# Patient Record
Sex: Male | Born: 1950
Health system: Southern US, Community
[De-identification: ages and names within clinical notes are randomized; demographics above are authoritative.]

## PROBLEM LIST (undated history)

## (undated) DIAGNOSIS — H269 Unspecified cataract: Secondary | ICD-10-CM

## (undated) DIAGNOSIS — N400 Enlarged prostate without lower urinary tract symptoms: Secondary | ICD-10-CM

## (undated) DIAGNOSIS — I639 Cerebral infarction, unspecified: Secondary | ICD-10-CM

## (undated) DIAGNOSIS — T7840XA Allergy, unspecified, initial encounter: Secondary | ICD-10-CM

## (undated) DIAGNOSIS — C801 Malignant (primary) neoplasm, unspecified: Secondary | ICD-10-CM

## (undated) DIAGNOSIS — R42 Dizziness and giddiness: Secondary | ICD-10-CM

## (undated) DIAGNOSIS — G4730 Sleep apnea, unspecified: Secondary | ICD-10-CM

## (undated) DIAGNOSIS — G901 Familial dysautonomia [Riley-Day]: Secondary | ICD-10-CM

## (undated) DIAGNOSIS — Z8673 Personal history of transient ischemic attack (TIA), and cerebral infarction without residual deficits: Secondary | ICD-10-CM

## (undated) DIAGNOSIS — N182 Chronic kidney disease, stage 2 (mild): Secondary | ICD-10-CM

## (undated) DIAGNOSIS — I10 Essential (primary) hypertension: Secondary | ICD-10-CM

## (undated) DIAGNOSIS — K118 Other diseases of salivary glands: Secondary | ICD-10-CM

## (undated) DIAGNOSIS — M1990 Unspecified osteoarthritis, unspecified site: Secondary | ICD-10-CM

## (undated) DIAGNOSIS — K589 Irritable bowel syndrome without diarrhea: Secondary | ICD-10-CM

## (undated) DIAGNOSIS — L719 Rosacea, unspecified: Secondary | ICD-10-CM

## (undated) DIAGNOSIS — Q211 Atrial septal defect: Secondary | ICD-10-CM

## (undated) HISTORY — DX: Allergy, unspecified, initial encounter: T78.40XA

## (undated) HISTORY — DX: Personal history of transient ischemic attack (TIA), and cerebral infarction without residual deficits: Z86.73

## (undated) HISTORY — DX: Malignant (primary) neoplasm, unspecified: C80.1

## (undated) HISTORY — DX: Familial dysautonomia (riley-day): G90.1

## (undated) HISTORY — DX: Unspecified osteoarthritis, unspecified site: M19.90

## (undated) HISTORY — DX: Benign prostatic hyperplasia without lower urinary tract symptoms: N40.0

## (undated) HISTORY — DX: Atrial septal defect: Q21.1

## (undated) HISTORY — DX: Dizziness and giddiness: R42

## (undated) HISTORY — DX: Rosacea, unspecified: L71.9

## (undated) HISTORY — DX: Unspecified cataract: H26.9

## (undated) HISTORY — DX: Essential (primary) hypertension: I10

## (undated) HISTORY — DX: Irritable bowel syndrome without diarrhea: K58.9

## (undated) HISTORY — DX: Cerebral infarction, unspecified: I63.9

## (undated) HISTORY — DX: Chronic kidney disease, stage 2 (mild): N18.2

## (undated) HISTORY — DX: Sleep apnea, unspecified: G47.30

---
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
## 1997-05-06 HISTORY — PX: EYE SURGERY: SHX253

## 2013-04-08 DIAGNOSIS — N138 Other obstructive and reflux uropathy: Secondary | ICD-10-CM | POA: Insufficient documentation

## 2013-04-08 DIAGNOSIS — N400 Enlarged prostate without lower urinary tract symptoms: Secondary | ICD-10-CM

## 2013-04-08 HISTORY — DX: Benign prostatic hyperplasia without lower urinary tract symptoms: N40.0

## 2013-10-12 DIAGNOSIS — I10 Essential (primary) hypertension: Secondary | ICD-10-CM

## 2013-10-12 HISTORY — DX: Essential (primary) hypertension: I10

## 2015-12-25 DIAGNOSIS — Q211 Atrial septal defect: Secondary | ICD-10-CM | POA: Insufficient documentation

## 2015-12-25 DIAGNOSIS — Z8673 Personal history of transient ischemic attack (TIA), and cerebral infarction without residual deficits: Secondary | ICD-10-CM | POA: Insufficient documentation

## 2015-12-25 DIAGNOSIS — Q2112 Patent foramen ovale: Secondary | ICD-10-CM | POA: Insufficient documentation

## 2015-12-25 HISTORY — DX: Patent foramen ovale: Q21.12

## 2015-12-25 HISTORY — DX: Personal history of transient ischemic attack (TIA), and cerebral infarction without residual deficits: Z86.73

## 2015-12-25 HISTORY — DX: Atrial septal defect: Q21.1

## 2016-11-27 ENCOUNTER — Other Ambulatory Visit: Payer: Self-pay

## 2016-11-27 ENCOUNTER — Encounter: Payer: Self-pay | Admitting: Physician Assistant

## 2016-11-27 ENCOUNTER — Ambulatory Visit (INDEPENDENT_AMBULATORY_CARE_PROVIDER_SITE_OTHER): Payer: BLUE CROSS/BLUE SHIELD | Admitting: Physician Assistant

## 2016-11-27 VITALS — BP 137/75 | HR 82 | Ht 70.0 in | Wt 203.0 lb

## 2016-11-27 DIAGNOSIS — L719 Rosacea, unspecified: Secondary | ICD-10-CM | POA: Insufficient documentation

## 2016-11-27 DIAGNOSIS — Z1211 Encounter for screening for malignant neoplasm of colon: Secondary | ICD-10-CM | POA: Diagnosis not present

## 2016-11-27 DIAGNOSIS — I951 Orthostatic hypotension: Secondary | ICD-10-CM | POA: Diagnosis not present

## 2016-11-27 DIAGNOSIS — K589 Irritable bowel syndrome without diarrhea: Secondary | ICD-10-CM

## 2016-11-27 DIAGNOSIS — R42 Dizziness and giddiness: Secondary | ICD-10-CM | POA: Diagnosis not present

## 2016-11-27 HISTORY — DX: Irritable bowel syndrome, unspecified: K58.9

## 2016-11-27 MED ORDER — DICYCLOMINE HCL 10 MG PO CAPS: 10.0000 mg | ORAL_CAPSULE | Freq: Three times a day (TID) | ORAL | 0 refills | Status: DC

## 2016-11-27 NOTE — Progress Notes (Addendum)
HPI:                                                                Blake Rice is a 66 y.o. male who presents to [facility]: [facility] today for lightheadedness  Blake Rice is a current patient at [facility]. He states he "might switch here." Today he presents for a "second opinion" on his chronic lightheadedness.  Patient with PMH of CVA, PFO, HTN, BPH, and IBS reports intermittent episodes of lightheadedness lasting seconds. Reports this occurs following moderate and high intensity exertion, such as running 6 miles, or with position changes. Symptoms resolve on their own. Denies dizziness or LOC. This has been going on for years since 2003. He reports episodes are becoming more frequent and worsening. He feels it may be related to IBS because sometimes he will burp and it improves his lightheadedness. He has been evaluated by neurology and cardiology. He had a negative exercise stress in 01/2016 and a negative transcranial [place] vasoreactivity test 03/2016. MRI brain from 12/2015 was unremarkable except for mild chronic ischemic changes and possible old infarct in the posterior insular cortex. Reports carotid ultrasound was negative Last TSH 1.45 (10/2016)  He is also interested in Cologuard. Reports colonoscopy with 1 polyp in 2005. Denies family history of colon cancer and hereditary colon polyps disorder.   Health Maintenance Health Maintenance  Topic Date Due  . Hepatitis C Screening  01-22-1951  . HIV Screening  01/30/1968  . TETANUS/TDAP  01/30/1972  . COLONOSCOPY  01/30/2003  . INFLUENZA VACCINE  12/04/2016     Past Medical History:  Diagnosis Date  . BPH (benign prostatic hyperplasia) 04/08/2013  . Episodic lightheadedness   . History of CVA (cerebrovascular accident) 12/25/2015  . HTN (hypertension) 10/12/2013  . Irritable bowel syndrome 11/27/2016  . PFO (patent foramen ovale) 12/25/2015  . Rosacea    Past Surgical History:   Procedure Laterality Date  . EYE SURGERY Bilateral 1999   cataract   Social History  Substance Use Topics  . Smoking status: Former Smoker    Packs/day: 1.00    Years: 27.00    Types: Cigarettes    Quit date: 08/10/2000  . Smokeless tobacco: Never Used  . Alcohol use Yes   family history is not on file.  ROS: Review of Systems  Constitutional: Negative.   HENT: Negative.   Eyes: Negative.   Respiratory: Negative.   Cardiovascular: Negative.   Gastrointestinal: Negative.   Genitourinary:       + nocturia  Musculoskeletal: Negative.   Skin: Negative.   Neurological: Negative for tingling, tremors, sensory change, speech change, focal weakness, seizures, loss of consciousness and headaches.       + lightheadedness  Endo/Heme/Allergies: Negative.   Psychiatric/Behavioral: Negative.      Medications: Current Outpatient Prescriptions  Medication Sig Dispense Refill  . ampicillin (PRINCIPEN) 500 MG capsule Take 500 mg by mouth daily.    Marland Kitchen aspirin EC 81 MG tablet Take 81 mg by mouth daily.    . clindamycin (CLINDAGEL) 1 % gel Apply topically 2 (two) times daily.    . finasteride (PROSCAR) 5 MG tablet Take 5 mg by mouth daily.    . tamsulosin (FLOMAX) 0.4 MG CAPS capsule Take 0.4  mg by mouth daily.    Marland Kitchen dicyclomine (BENTYL) 10 MG capsule Take 1 capsule (10 mg total) by mouth 4 (four) times daily -  before meals and at bedtime. 120 capsule 0   No current facility-administered medications for this visit.    Allergies  Allergen Reactions  . Oxytetracycline Other (See Comments)    Unknown       Objective:  BP 137/75   Pulse 82   Ht 5\' 10"  (1.778 m)   Wt 203 lb (92.1 kg)   SpO2 95%   BMI 29.13 kg/m  Gen: well-groomed, cooperative, not ill-appearing, no distress HEENT: normal conjunctiva, TM's clear, oropharynx clear, moist mucus membranes, no thyromegaly or tenderness Pulm: Normal work of breathing, normal phonation, clear to auscultation bilaterally CV: Normal  rate, regular rhythm, s1 and s2 distinct, no murmurs, clicks or rubs, no carotid bruit Neuro: alert and oriented x 3, EOM's intact, PERRLA, DTR's intact, normal tone, no tremor MSK: moving all extremities, normal gait and station, no peripheral edema Skin: warm and dry, no rashes or lesions on exposed skin Psych: normal affect, euthymic mood, normal speech and thought content   No results found for this or any previous visit (from the past 72 hour(s)). No results found.  No flowsheet data found. Orthostatic VS for the past 24 hrs:  BP- Lying Pulse- Lying BP- Sitting Pulse- Sitting BP- Standing at 0 minutes Pulse- Standing at 0 minutes  11/27/16 1138 152/82 81 137/84 89 117/76 91       Assessment and Plan: 66 y.o. male with    1. Colon cancer screening - discussed with history of polyp this increases likelihood of a positive Cologuard and would trigger a need for diagnostic colonoscopy. Patient expressed understanding and wanted to move forward with Cologuard. Order placed  2. Episodic lightheadedness - reviewed outside records in [place], including recent labs from 10/24/16 and 11/25/16, Neuro Transcranial Vasoreactivity Test (03/20/16), Exercise Stress (01/17/16) and MR Brain 01/03/16 - vital signs showed orthostasis. Given his negative cardiac and neuro work-ups and orthostatic BP's, chronic orthostatic hypotension is most likely the cause of his lightheadedness. He does not have any cerebellar dysfunction or dysautonomia - patient is on Flomax and Finasteride for BPH, which is known to cause orthostatic hypotension. Recommended a trial of holding his Flomax. Patient was reluctant to do so because of his obstructive symptoms - offered Echocardiogram to r/o valvular insufficiency and heart failure. Patient declined  3. Irritable bowel syndrome, unspecified type - dicyclomine (BENTYL) 10 MG capsule; Take 1 capsule (10 mg total) by mouth 4 (four) times daily -  before meals and at  bedtime.  Dispense: 120 capsule; Refill: 0   No orders of the defined types were placed in this encounter.   Patient education and anticipatory guidance given Patient agrees with treatment plan Follow-up in 4 weeks or sooner as needed  Darlyne Russian PA-C

## 2016-11-27 NOTE — Patient Instructions (Addendum)
-   Let's trial low-dose antispasmodic for your IBS  - I do think we should get an echocardiogram (ultrasound of your heart) to make sure your heart is structurally normal, that there is no valvular disease or pumping dysfunction  - At some point I would like to trial coming off of your Flomax and seeing how your symptoms respond. You can discuss this plan with your Urologist     - You will be contacted by [employer] about your Cologuard. We recommend contacting your insurance company ahead of time to see if you will have a co-pay or out-of-pocket cost [place] [phone]

## 2016-11-28 ENCOUNTER — Encounter: Payer: Self-pay | Admitting: Physician Assistant

## 2016-11-28 DIAGNOSIS — I951 Orthostatic hypotension: Secondary | ICD-10-CM | POA: Insufficient documentation

## 2016-11-28 DIAGNOSIS — R81 Glycosuria: Secondary | ICD-10-CM | POA: Insufficient documentation

## 2016-11-28 DIAGNOSIS — C678 Malignant neoplasm of overlapping sites of bladder: Secondary | ICD-10-CM | POA: Insufficient documentation

## 2016-12-01 ENCOUNTER — Encounter: Payer: Self-pay | Admitting: Physician Assistant

## 2016-12-02 ENCOUNTER — Other Ambulatory Visit: Payer: Self-pay | Admitting: Physician Assistant

## 2016-12-02 DIAGNOSIS — R42 Dizziness and giddiness: Secondary | ICD-10-CM

## 2016-12-02 NOTE — Progress Notes (Signed)
Echo ordered per Pt request, information in last OV where Provider offered imaging.

## 2016-12-04 ENCOUNTER — Ambulatory Visit (HOSPITAL_BASED_OUTPATIENT_CLINIC_OR_DEPARTMENT_OTHER)
Admission: RE | Admit: 2016-12-04 | Discharge: 2016-12-04 | Disposition: A | Discharge status: Home / Self Care | Payer: BLUE CROSS/BLUE SHIELD | Source: Ambulatory Visit | Attending: Physician Assistant | Admitting: Physician Assistant

## 2016-12-04 DIAGNOSIS — Z8673 Personal history of transient ischemic attack (TIA), and cerebral infarction without residual deficits: Secondary | ICD-10-CM | POA: Insufficient documentation

## 2016-12-04 DIAGNOSIS — I10 Essential (primary) hypertension: Secondary | ICD-10-CM | POA: Insufficient documentation

## 2016-12-04 DIAGNOSIS — R42 Dizziness and giddiness: Secondary | ICD-10-CM

## 2016-12-04 DIAGNOSIS — I77810 Thoracic aortic ectasia: Secondary | ICD-10-CM | POA: Diagnosis not present

## 2016-12-04 NOTE — Progress Notes (Signed)
  Echocardiogram 2D Echocardiogram has been performed.  Blake Rice 12/04/2016, 3:00 PM

## 2016-12-05 ENCOUNTER — Encounter: Payer: Self-pay | Admitting: Physician Assistant

## 2016-12-05 DIAGNOSIS — Z9289 Personal history of other medical treatment: Secondary | ICD-10-CM | POA: Insufficient documentation

## 2016-12-05 NOTE — Progress Notes (Signed)
Hi Blake Rice,  Your echocardiogram was normal. Your heart is structurally normal, no evidence of valvular disease. It's pumping ability (ejection fraction) is great.  I still feel strongly that your symptoms are related to orthostasis, which could be caused/worsened by your prostate medications. When possible, I would recommend trialing coming off of these medicines and seeing if symptoms improve.  Best, Evlyn Clines

## 2016-12-08 ENCOUNTER — Encounter: Payer: Self-pay | Admitting: Physician Assistant

## 2016-12-09 ENCOUNTER — Telehealth: Payer: Self-pay | Admitting: *Deleted

## 2016-12-09 NOTE — Telephone Encounter (Signed)
Called patient to see if he wanted to [place] to refer for colonoscopy since we received notice that cologaurd has not been turned in.

## 2016-12-12 ENCOUNTER — Ambulatory Visit (INDEPENDENT_AMBULATORY_CARE_PROVIDER_SITE_OTHER): Payer: BLUE CROSS/BLUE SHIELD | Admitting: Physician Assistant

## 2016-12-12 VITALS — BP 150/81 | HR 92 | Temp 97.4°F | Wt 199.0 lb

## 2016-12-12 DIAGNOSIS — I951 Orthostatic hypotension: Secondary | ICD-10-CM

## 2016-12-12 DIAGNOSIS — K589 Irritable bowel syndrome without diarrhea: Secondary | ICD-10-CM | POA: Diagnosis not present

## 2016-12-12 DIAGNOSIS — R42 Dizziness and giddiness: Secondary | ICD-10-CM | POA: Diagnosis not present

## 2016-12-12 MED ORDER — PEPPERMINT OIL 50 MG PO CPDR: 50.0000 mg | DELAYED_RELEASE_CAPSULE | Freq: Every day | ORAL | 11 refills | Status: DC

## 2016-12-12 NOTE — Progress Notes (Signed)
HPI:                                                                Blake Rice is a 66 y.o. male who presents to [facility] today for IBS follow-up  Lightheadedness: Patient reports he self-discontinued his Finasteride 2 weeks ago. He also self-discontinued Flomax for 3 days and noted that his lightheadedness completely resolved. However, he was having worsening lower urinary tract obstructive symptoms and was afraid he would not be able to urinate. After resuming the Flomax, lightheadedness returned. Echocardiogram from 12/04/16 was normal.  IBS: did not take the Bentyl because he was concerned it could worsen his lightheadedness. Notices increased abdominal discomfort when he feels anxious. Denies fever, chills, unintended weight loss, melena, hematochezia, tenesmus.  Past Medical History:  Diagnosis Date  . BPH (benign prostatic hyperplasia) 04/08/2013  . Episodic lightheadedness   . History of CVA (cerebrovascular accident) 12/25/2015  . HTN (hypertension) 10/12/2013  . Irritable bowel syndrome 11/27/2016  . PFO (patent foramen ovale) 12/25/2015  . Rosacea    Past Surgical History:  Procedure Laterality Date  . EYE SURGERY Bilateral 1999   cataract   Social History  Substance Use Topics  . Smoking status: Former Smoker    Packs/day: 1.00    Years: 27.00    Types: Cigarettes    Quit date: 08/10/2000  . Smokeless tobacco: Never Used  . Alcohol use Yes   family history is not on file.  ROS: negative except as noted in the HPI  Medications: Current Outpatient Prescriptions  Medication Sig Dispense Refill  . ampicillin (PRINCIPEN) 500 MG capsule Take 500 mg by mouth daily.    Marland Kitchen aspirin EC 81 MG tablet Take 81 mg by mouth daily.    . clindamycin (CLINDAGEL) 1 % gel Apply topically 2 (two) times daily.    Marland Kitchen Peppermint Oil 50 MG CPDR Take 50 mg by mouth daily. 30 capsule 11  . tamsulosin (FLOMAX) 0.4 MG CAPS capsule Take 0.4 mg by  mouth daily.     No current facility-administered medications for this visit.    Allergies  Allergen Reactions  . Oxytetracycline Other (See Comments)    Unknown       Objective:  BP (!) 150/81 (BP Location: Left Arm, Patient Position: Sitting, Cuff Size: Normal)   Pulse 92   Temp (!) 97.4 F (36.3 C) (Oral)   Wt 199 lb (90.3 kg)   SpO2 93%   BMI 28.55 kg/m  Gen: not ill-appearing, no distress, appears stated age HEENT: head normocephalic without obvious deformity, normal conjunctiva, trachea midline Pulm: Normal work of breathing, normal phonation Neuro: alert and oriented x 3, no tremor MSK: moving all extremities, normal gait and station, no peripheral edema Skin: intact; rosacea of the nose, no cyanosis Psych: well-groomed, cooperative, good eye contact, normal affect, normal speech and thought content   No results found for this or any previous visit (from the past 72 hour(s)). No results found.  No flowsheet data found.    Assessment and Plan: 66 y.o. male with   1. Irritable bowel syndrome, unspecified type - Peppermint Oil 50 MG CPDR; Take 50 mg by mouth daily.  Dispense: 30 capsule; Refill: 11 - provided with FOBT cards  2. Orthostasis - demonstrated on PE at last visit. secondary to Tamsulosin - unfortunately due to BPH, it is not a good idea to discontinue this medication right now. Patient has an appointment with Urology next week to discuss surgical options - instructed to liberalize dietary salt - when running, wear camel back with gatorade - avoid known triggers  3. Episodic lightheadedness - felt to be orthostasis as above - normal echo 12/04/2016 - negative cardiac and neuro work-ups int he past including Neuro Transcranial Vasoreactivity Test (03/20/16), Exercise Stress (01/17/16) and MR Brain 01/03/16  Patient education and anticipatory guidance given Patient agrees with treatment plan Follow-up as needed if symptoms worsen or fail to  improve  Darlyne Russian PA-C

## 2016-12-12 NOTE — Patient Instructions (Addendum)
- Continue Flomax. Discuss alternatives with Urology next week - Liberalize salt in your diet - Run with camel back and gatorade - Avoid triggers for lightheadedness, perhaps by shortening the duration of your runs   - Return fecal occult blood test for colon cancer screening   Orthostatic Hypotension Orthostatic hypotension is a sudden drop in blood pressure that happens when you quickly change positions, such as when you get up from a seated or lying position. Blood pressure is a measurement of how strongly, or weakly, your blood is pressing against the walls of your arteries. Arteries are blood vessels that carry blood from your heart throughout your body. When blood pressure is too low, you may not get enough blood to your brain or to the rest of your organs. This can cause weakness, light-headedness, rapid heartbeat, and fainting. This can last for just a few seconds or for up to a few minutes. Orthostatic hypotension is usually not a serious problem. However, if it happens frequently or gets worse, it may be a sign of something more serious. What are the causes? This condition may be caused by:  Sudden changes in posture, such as standing up quickly after you have been sitting or lying down.  Blood loss.  Loss of body fluids (dehydration).  Heart problems.  Hormone (endocrine) problems.  Pregnancy.  Severe infection.  Lack of certain nutrients.  Severe allergic reactions (anaphylaxis).  Certain medicines, such as blood pressure medicine or medicines that make the body lose excess fluids (diuretics). Sometimes, this condition can be caused by not taking medicine as directed, such as taking too much of a certain medicine.  What increases the risk? Certain factors can make you more likely to develop orthostatic hypotension, including:  Age. Risk increases as you get older.  Conditions that affect the heart or the central nervous system.  Taking certain medicines, such as  blood pressure medicine or diuretics.  Being pregnant.  What are the signs or symptoms? Symptoms of this condition may include:  Weakness.  Light-headedness.  Dizziness.  Blurred vision.  Fatigue.  Rapid heartbeat.  Fainting, in severe cases.  How is this diagnosed? This condition is diagnosed based on:  Your medical history.  Your symptoms.  Your blood pressure measurement. Your health care provider will check your blood pressure when you are: ? Lying down. ? Sitting. ? Standing.  A blood pressure reading is recorded as two numbers, such as "120 over 80" (or 120/80). The first ("top") number is called the systolic pressure. It is a measure of the pressure in your arteries as your heart beats. The second ("bottom") number is called the diastolic pressure. It is a measure of the pressure in your arteries when your heart relaxes between beats. Blood pressure is measured in a unit called mm Hg. Healthy blood pressure for adults is 120/80. If your blood pressure is below 90/60, you may be diagnosed with hypotension. Other information or tests that may be used to diagnose orthostatic hypotension include:  Your other vital signs, such as your heart rate and temperature.  Blood tests.  Tilt table test. For this test, you will be safely secured to a table that moves you from a lying position to an upright position. Your heart rhythm and blood pressure will be monitored during the test.  How is this treated? Treatment for this condition may include:  Changing your diet. This may involve eating more salt (sodium) or drinking more water.  Taking medicines to raise your blood  pressure.  Changing the dosage of certain medicines you are taking that might be lowering your blood pressure.  Wearing compression stockings. These stockings help to prevent blood clots and reduce swelling in your legs.  In some cases, you may need to go to the hospital for:  Fluid replacement. This  means you will receive fluids through an IV tube.  Blood replacement. This means you will receive donated blood through an IV tube (transfusion).  Treating an infection or heart problems, if this applies.  Monitoring. You may need to be monitored while medicines that you are taking wear off.  Follow these instructions at home: Eating and drinking   Drink enough fluid to keep your urine clear or pale yellow.  Eat a healthy diet and follow instructions from your health care provider about eating or drinking restrictions. A healthy diet includes: ? Fresh fruits and vegetables. ? Whole grains. ? Lean meats. ? Low-fat dairy products.  Eat extra salt only as directed. Do not add extra salt to your diet unless your health care provider told you to do that.  Eat frequent, small meals.  Avoid standing up suddenly after eating. Medicines  Take over-the-counter and prescription medicines only as told by your health care provider. ? Follow instructions from your health care provider about changing the dosage of your current medicines, if this applies. ? Do not stop or adjust any of your medicines on your own. General instructions  Wear compression stockings as told by your health care provider.  Get up slowly from lying down or sitting positions. This gives your blood pressure a chance to adjust.  Avoid hot showers and excessive heat as directed by your health care provider.  Return to your normal activities as told by your health care provider. Ask your health care provider what activities are safe for you.  Do not use any products that contain nicotine or tobacco, such as cigarettes and e-cigarettes. If you need help quitting, ask your health care provider.  Keep all follow-up visits as told by your health care provider. This is important. Contact a health care provider if:  You vomit.  You have diarrhea.  You have a fever for more than 2-3 days.  You feel more thirsty than  usual.  You feel weak and tired. Get help right away if:  You have chest pain.  You have a fast or irregular heartbeat.  You develop numbness in any part of your body.  You cannot move your arms or your legs.  You have trouble speaking.  You become sweaty or feel lightheaded.  You faint.  You feel short of breath.  You have trouble staying awake.  You feel confused. This information is not intended to replace advice given to you by your health care provider. Make sure you discuss any questions you have with your health care provider. Document Released: 04/12/2002 Document Revised: 01/09/2016 Document Reviewed: 10/13/2015 Elsevier Interactive Patient Education  2018 [employer].

## 2016-12-19 ENCOUNTER — Other Ambulatory Visit: Payer: Self-pay | Admitting: Physician Assistant

## 2016-12-19 ENCOUNTER — Encounter: Payer: Self-pay | Admitting: Physician Assistant

## 2016-12-19 DIAGNOSIS — K589 Irritable bowel syndrome without diarrhea: Secondary | ICD-10-CM

## 2016-12-19 LAB — HEMOCCULT GUIAC POC 1CARD (OFFICE)
Card #3 Fecal Occult Blood, POC: NEGATIVE
FECAL OCCULT BLD: NEGATIVE
Fecal Occult Blood, POC: NEGATIVE

## 2016-12-19 NOTE — Progress Notes (Signed)
Hi Bogdan,  Your stool study was negative for blood. You can repeat this in 1 year for colon cancer screening.  Best, Evlyn Clines

## 2016-12-25 ENCOUNTER — Ambulatory Visit: Payer: BLUE CROSS/BLUE SHIELD | Admitting: Physician Assistant

## 2016-12-30 HISTORY — PX: TRANSURETHRAL RESECTION OF BLADDER TUMOR WITH GYRUS (TURBT-GYRUS): SHX6458

## 2017-06-16 HISTORY — PX: TRANSURETHRAL RESECTION OF PROSTATE: SHX73

## 2018-02-03 ENCOUNTER — Ambulatory Visit (INDEPENDENT_AMBULATORY_CARE_PROVIDER_SITE_OTHER): Payer: Medicare Other | Admitting: Physician Assistant

## 2018-02-03 ENCOUNTER — Encounter: Payer: Self-pay | Admitting: Physician Assistant

## 2018-02-03 VITALS — BP 155/90 | HR 89 | Temp 98.6°F | Wt 208.0 lb

## 2018-02-03 DIAGNOSIS — Z1211 Encounter for screening for malignant neoplasm of colon: Secondary | ICD-10-CM

## 2018-02-03 DIAGNOSIS — Z8546 Personal history of malignant neoplasm of prostate: Secondary | ICD-10-CM

## 2018-02-03 DIAGNOSIS — R1084 Generalized abdominal pain: Secondary | ICD-10-CM | POA: Diagnosis not present

## 2018-02-03 DIAGNOSIS — Z8551 Personal history of malignant neoplasm of bladder: Secondary | ICD-10-CM

## 2018-02-03 DIAGNOSIS — R350 Frequency of micturition: Secondary | ICD-10-CM | POA: Insufficient documentation

## 2018-02-03 DIAGNOSIS — R809 Proteinuria, unspecified: Secondary | ICD-10-CM

## 2018-02-03 DIAGNOSIS — I951 Orthostatic hypotension: Secondary | ICD-10-CM | POA: Diagnosis not present

## 2018-02-03 DIAGNOSIS — R140 Abdominal distension (gaseous): Secondary | ICD-10-CM

## 2018-02-03 LAB — POCT URINALYSIS DIPSTICK
Bilirubin, UA: NEGATIVE
Blood, UA: NEGATIVE
GLUCOSE UA: NEGATIVE
Ketones, UA: NEGATIVE
Leukocytes, UA: NEGATIVE
Nitrite, UA: NEGATIVE
PROTEIN UA: POSITIVE — AB
Spec Grav, UA: >=1.03 — AB (ref 1.010–1.025)
Urobilinogen, UA: 1 E.U./dL
pH, UA: 5.5 (ref 5.0–8.0)

## 2018-02-03 NOTE — Patient Instructions (Signed)
For lightheadedness/orthostasis: Option to start a medication that constricts the blood vessels and raises blood pressure called Midodrine. Blood pressure would need to be monitored closely  Continue other general measures:  Arising slowly, in stages, from supine to seated to standing. This maneuver is most important in the morning, when orthostatic tolerance is lowest.  ?Avoiding straining, violent coughing, or walking in very hot weather, as these activities reduce venous return and worsen orthostatic hypotension. If necessary, treat constipation with stool softeners and diet changes, and use cough suppressants when these triggers are problematic.  ?Maintaining adequate hydration and avoiding overheating. ?Avoiding large meals  ?Ingesting meals low in carbohydrate  ?Avoiding alcohol intake  ?Drinking water with meals  ?Avoiding activities or sudden standing immediately after eating

## 2018-02-03 NOTE — Progress Notes (Signed)
HPI:                                                                Blake Rice is a 67 y.o. male who presents to [facility] today for abdominal bloating / urinary frequency   This is a pleasant 67 yo M with recent PMH of malignant neoplasm of bladder (papillary urothelial carcinoma) s/p TURBT (Aug 2018) and prostate cancer Gleason 6 s/p TURP (Feb 2019) as well as long-standing history of HTN, orthostasis, IBS, hx of CVA (2017) and rosacea.  For 2 weeks he has had abdominal bloating and "a dull stomach ache." He is also having urinary frequency, urgency, and nocturia twice per night. Denies change in bowel habits. Denies fever, chills, nausea, vomiting. He is followed by Dr. Ihor Dow Q31months for history of bladder cancer. He was self-catheterizing due to urinary retention for some time, but since his TURP he has not had any recent catheterization IPSS Questionnaire (AUA-7): Over the past month.   1)  How often have you had a sensation of not emptying your bladder completely after you finish urinating?  0 - Not at all  2)  How often have you had to urinate again less than two hours after you finished urinating? 4 - More than half the time  3)  How often have you found you stopped and started again several times when you urinated?  1 - Less than 1 time in 5  4) How difficult have you found it to postpone urination?  0 - Not at all  5) How often have you had a weak urinary stream?  4 - More than half the time  6) How often have you had to push or strain to begin urination?  0 - Not at all  7) How many times did you most typically get up to urinate from the time you went to bed until the time you got up in the morning?  2 - 2 times  Total score:  0-7 mildly symptomatic   8-19 moderately symptomatic   20-35 severely symptomatic    He also c/o recurrent positional lightheadedness worse with prolonged sitting/position changes beginning  in August. He had extensive cardiac and neurologic work-ups and symptoms were felt to be due to orthostasis 2/2 Flomax. After discontinuing Flomax he was symptom-free for approx 6-8 months. Unfortunately his symptoms have returned.  Lastly would like to complete Cologuard for colon cancer screening. Denies family history of colon cancer or polyposis. Personal history of normal colonoscopy over 10 years ago.  Depression screen PHQ 2/9 02/03/2018  Decreased Interest 0  Down, Depressed, Hopeless 0  PHQ - 2 Score 0    No flowsheet data found.    Past Medical History:  Diagnosis Date  . BPH (benign prostatic hyperplasia) 04/08/2013  . Episodic lightheadedness   . History of CVA (cerebrovascular accident) 12/25/2015  . HTN (hypertension) 10/12/2013  . Irritable bowel syndrome 11/27/2016  . PFO (patent foramen ovale) 12/25/2015  . Rosacea    Past Surgical History:  Procedure Laterality Date  . EYE SURGERY Bilateral 1999   cataract  . TRANSURETHRAL RESECTION OF BLADDER TUMOR WITH GYRUS (TURBT-GYRUS)  12/30/2016  . TRANSURETHRAL RESECTION OF PROSTATE  06/16/2017   Social  History   Tobacco Use  . Smoking status: Former Smoker    Packs/day: 1.00    Years: 27.00    Pack years: 27.00    Types: Cigarettes    Last attempt to quit: 08/10/2000    Years since quitting: 17.5  . Smokeless tobacco: Never Used  Substance Use Topics  . Alcohol use: Yes   family history is not on file.    ROS: negative except as noted in the HPI  Medications: Current Outpatient Medications  Medication Sig Dispense Refill  . aspirin EC 81 MG tablet Take 81 mg by mouth daily.    Marland Kitchen ampicillin (PRINCIPEN) 500 MG capsule Take 1 capsule (500 mg total) by mouth daily. 90 capsule 3  . clindamycin (CLEOCIN T) 1 % SWAB Apply topically two times daily 60 Package 0  . phenazopyridine (PYRIDIUM) 200 MG tablet Take by mouth.     No current facility-administered medications for this visit.    Allergies  Allergen  Reactions  . Oxytetracycline Other (See Comments)    Unknown FOUND WITH ALLERGY SKIN TEST AT AGE OF 6       Objective:  BP (!) 155/90   Pulse 89   Temp 98.6 F (37 C) (Oral)   Wt 208 lb (94.3 kg)   BMI 29.84 kg/m  Gen:  alert, not ill-appearing, no distress, appropriate for age HEENT: head normocephalic without obvious abnormality, conjunctiva and cornea clear, trachea midline Pulm: Normal work of breathing, normal phonation, clear to auscultation bilaterally, no wheezes, rales or rhonchi CV: Normal rate, regular rhythm, s1 and s2 distinct, no murmurs, clicks or rubs  GI: abdomen soft, mildly distended, no palpable fluid wave, no tenderness, no palpable masses Neuro: alert and oriented x 3, no tremor MSK: extremities atraumatic, normal gait and station Skin: intact, no rashes on exposed skin, no jaundice, no cyanosis    No results found for this or any previous visit (from the past 72 hour(s)). No results found.    Assessment and Plan: 67 y.o. male with   .[name] was seen today for bloated.  Diagnoses and all orders for this visit:  Abdominal distension -     CBC with Differential/Platelet -     COMPLETE METABOLIC PANEL WITH GFR -     Lipase -     Hemoglobin A1c -     US Abdomen Complete; Future  Urinary frequency -     Hemoglobin A1c -     POCT Urinalysis Dipstick -     Urine Culture  Generalized abdominal pain  Proteinuria, unspecified type -     POCT Urinalysis Dipstick -     Urine Culture  Colon cancer screening Comments: Cologuard ordered 02/03/18 Orders: -     Cologuard  History of primary bladder cancer Comments: papillary urothelial carcinoma s/p TURBT 12/30/16  History of prostate cancer Comments: Gleason 6 adenocarcinoma s/p TURP 06/2017  Orthostasis   - personally reviewed most recent Urology office note dated 12/19/17 - personally reviewed CT abdomen pelvis urogram report dated 01/13/17, which showed small hypodense liver lesion.  Patient was unaware of this finding. Given his symptoms today, will obtain abdominal US. However, explained to patient that MR Liver was recommended by radiology to evaluate this lesion - CBC, CMP, lipase pending - UA was positive for protein - AUASS 11, QOL 5, primarily irritative symptoms, recommended close follow-up with Dr. Tommi Rumps for change in urinary symptoms  - for persistent orthostasis, we discussed option to discuss Midodrine. However, BP was elevated  in office today and this would need to be monitored closely. Patient declined at this time. Counseled on general measures for orthostasis.  Patient education and anticipatory guidance given Patient agrees with treatment plan Follow-up as needed if symptoms worsen or fail to improve  Darlyne Russian PA-C

## 2018-02-04 ENCOUNTER — Ambulatory Visit (INDEPENDENT_AMBULATORY_CARE_PROVIDER_SITE_OTHER): Payer: Medicare Other

## 2018-02-04 DIAGNOSIS — R140 Abdominal distension (gaseous): Secondary | ICD-10-CM

## 2018-02-04 LAB — CBC WITH DIFFERENTIAL/PLATELET
BASOS ABS: 70 {cells}/uL (ref 0–200)
Basophils Relative: 1.1 %
Eosinophils Absolute: 179 cells/uL (ref 15–500)
Eosinophils Relative: 2.8 %
HCT: 44.7 % (ref 38.5–50.0)
HEMOGLOBIN: 15.5 g/dL (ref 13.2–17.1)
Lymphs Abs: 2010 cells/uL (ref 850–3900)
MCH: 31.6 pg (ref 27.0–33.0)
MCHC: 34.7 g/dL (ref 32.0–36.0)
MCV: 91.2 fL (ref 80.0–100.0)
MPV: 9.7 fL (ref 7.5–12.5)
Monocytes Relative: 10.3 %
NEUTROS ABS: 3482 {cells}/uL (ref 1500–7800)
Neutrophils Relative %: 54.4 %
Platelets: 239 10*3/uL (ref 140–400)
RBC: 4.9 10*6/uL (ref 4.20–5.80)
RDW: 12.8 % (ref 11.0–15.0)
Total Lymphocyte: 31.4 %
WBC mixed population: 659 cells/uL (ref 200–950)
WBC: 6.4 10*3/uL (ref 3.8–10.8)

## 2018-02-04 LAB — LIPASE: LIPASE: 19 U/L (ref 7–60)

## 2018-02-04 LAB — URINE CULTURE
MICRO NUMBER: 91179075
Result:: NO GROWTH
SPECIMEN QUALITY:: ADEQUATE

## 2018-02-04 LAB — COMPLETE METABOLIC PANEL WITH GFR
AG Ratio: 1.7 (calc) (ref 1.0–2.5)
ALBUMIN MSPROF: 4.5 g/dL (ref 3.6–5.1)
ALKALINE PHOSPHATASE (APISO): 80 U/L (ref 40–115)
ALT: 24 U/L (ref 9–46)
AST: 20 U/L (ref 10–35)
BUN: 20 mg/dL (ref 7–25)
CO2: 26 mmol/L (ref 20–32)
CREATININE: 1.2 mg/dL (ref 0.70–1.25)
Calcium: 9.5 mg/dL (ref 8.6–10.3)
Chloride: 106 mmol/L (ref 98–110)
GFR, Est African American: 73 mL/min/{1.73_m2} (ref >=60)
GFR, Est Non African American: 63 mL/min/{1.73_m2} (ref >=60)
GLUCOSE: 99 mg/dL (ref 65–139)
Globulin: 2.7 g/dL (calc) (ref 1.9–3.7)
Potassium: 4.8 mmol/L (ref 3.5–5.3)
Sodium: 142 mmol/L (ref 135–146)
Total Bilirubin: 0.7 mg/dL (ref 0.2–1.2)
Total Protein: 7.2 g/dL (ref 6.1–8.1)

## 2018-02-04 LAB — HEMOGLOBIN A1C
EAG (MMOL/L): 6 (calc)
HEMOGLOBIN A1C: 5.4 %{Hb} (ref <5.7)
Mean Plasma Glucose: 108 (calc)

## 2018-02-05 ENCOUNTER — Other Ambulatory Visit: Payer: Self-pay | Admitting: Physician Assistant

## 2018-02-05 DIAGNOSIS — K769 Liver disease, unspecified: Secondary | ICD-10-CM

## 2018-02-05 NOTE — Progress Notes (Signed)
Good afternoon Blake Rice news. Your labs look great and your ultrasound showed only a significant amount of bowel gas and a small calcification in your right kidney. The bowel gas likely explains your bloating and abdominal discomfort.  The liver appears normal on ultrasound, which is reassuring. However, I would recommend just to be prudent that we obtain an MRI of your liver to be certain there is no lesion. MRI is more sensitive than ultrasound and was the recommended imaging modality.  Just so you know what I am referring to, your CT Abdomen/Pelvis Urogram from September 2018 showed "Small hypodense lesion in the liver is indeterminate. Liver MRI with and without contrast would be helpful in further evaluation."   Let me know if you have any questions or concerns. Evlyn Clines

## 2018-02-08 ENCOUNTER — Encounter: Payer: Self-pay | Admitting: Physician Assistant

## 2018-02-09 ENCOUNTER — Other Ambulatory Visit: Payer: Self-pay | Admitting: Physician Assistant

## 2018-02-09 DIAGNOSIS — L719 Rosacea, unspecified: Secondary | ICD-10-CM

## 2018-02-09 MED ORDER — AMPICILLIN 500 MG PO CAPS: 500.0000 mg | ORAL_CAPSULE | Freq: Every day | ORAL | 3 refills | Status: DC

## 2018-02-09 MED ORDER — CLINDAMYCIN PHOSPHATE 1 % EX GEL: Freq: Two times a day (BID) | CUTANEOUS | 2 refills | Status: DC

## 2018-02-18 DIAGNOSIS — N3090 Cystitis, unspecified without hematuria: Secondary | ICD-10-CM | POA: Diagnosis not present

## 2018-02-18 DIAGNOSIS — C61 Malignant neoplasm of prostate: Secondary | ICD-10-CM | POA: Diagnosis not present

## 2018-02-18 DIAGNOSIS — R935 Abnormal findings on diagnostic imaging of other abdominal regions, including retroperitoneum: Secondary | ICD-10-CM | POA: Diagnosis not present

## 2018-02-18 DIAGNOSIS — N200 Calculus of kidney: Secondary | ICD-10-CM | POA: Diagnosis not present

## 2018-02-18 DIAGNOSIS — C679 Malignant neoplasm of bladder, unspecified: Secondary | ICD-10-CM | POA: Diagnosis not present

## 2018-02-23 ENCOUNTER — Other Ambulatory Visit: Payer: Self-pay

## 2018-02-23 MED ORDER — CLINDAMYCIN PHOSPHATE 1 % EX SWAB: CUTANEOUS | 0 refills | Status: DC

## 2018-02-24 ENCOUNTER — Encounter: Payer: Self-pay | Admitting: Physician Assistant

## 2018-02-24 DIAGNOSIS — R140 Abdominal distension (gaseous): Secondary | ICD-10-CM | POA: Insufficient documentation

## 2018-02-24 DIAGNOSIS — Z8551 Personal history of malignant neoplasm of bladder: Secondary | ICD-10-CM | POA: Insufficient documentation

## 2018-02-24 DIAGNOSIS — Z1211 Encounter for screening for malignant neoplasm of colon: Secondary | ICD-10-CM | POA: Insufficient documentation

## 2018-02-24 DIAGNOSIS — Z8546 Personal history of malignant neoplasm of prostate: Secondary | ICD-10-CM | POA: Insufficient documentation

## 2018-03-28 ENCOUNTER — Other Ambulatory Visit: Payer: Self-pay

## 2018-03-28 ENCOUNTER — Encounter: Payer: Self-pay | Admitting: Emergency Medicine

## 2018-03-28 ENCOUNTER — Emergency Department (INDEPENDENT_AMBULATORY_CARE_PROVIDER_SITE_OTHER)
Admission: EM | Admit: 2018-03-28 | Discharge: 2018-03-28 | Disposition: A | Discharge status: Home / Self Care | Payer: Medicare Other | Source: Home / Self Care | Attending: Family Medicine | Admitting: Family Medicine

## 2018-03-28 DIAGNOSIS — K1121 Acute sialoadenitis: Secondary | ICD-10-CM | POA: Diagnosis not present

## 2018-03-28 LAB — POCT CBC W AUTO DIFF (K'VILLE URGENT CARE)

## 2018-03-28 MED ORDER — AMOXICILLIN-POT CLAVULANATE 875-125 MG PO TABS: 1.0000 | ORAL_TABLET | Freq: Two times a day (BID) | ORAL | 0 refills | Status: DC

## 2018-03-28 NOTE — ED Provider Notes (Signed)
[facility]    CSN: [id number] Arrival date & time: 03/28/18  1046     History   Chief Complaint Chief Complaint  Patient presents with  . Temporomandibular Joint Pain    HPI Blake Rice is a 67 y.o. male.   Patient complains of bilateral TMJ pain three days ago with mild swelling.  He has felt hot and had a low grade fever of 99.2.  The history is provided by the patient.    Past Medical History:  Diagnosis Date  . BPH (benign prostatic hyperplasia) 04/08/2013  . Episodic lightheadedness   . History of CVA (cerebrovascular accident) 12/25/2015  . HTN (hypertension) 10/12/2013  . Irritable bowel syndrome 11/27/2016  . PFO (patent foramen ovale) 12/25/2015  . Rosacea     Patient Active Problem List   Diagnosis Date Noted  . History of primary bladder cancer 02/24/2018  . History of prostate cancer 02/24/2018  . Abdominal distension 02/24/2018  . Colon cancer screening 02/24/2018  . Proteinuria 02/03/2018  . Urinary frequency 02/03/2018  . Hx of echocardiogram 12/05/2016  . Glucosuria 11/28/2016  . Orthostasis 11/28/2016  . Urinary retention due to benign prostatic hyperplasia 11/28/2016  . Episodic lightheadedness 11/27/2016  . Irritable bowel syndrome 11/27/2016  . Rosacea   . History of CVA (cerebrovascular accident) 12/25/2015  . PFO (patent foramen ovale) 12/25/2015  . HTN (hypertension) 10/12/2013  . BPH (benign prostatic hyperplasia) 04/08/2013    Past Surgical History:  Procedure Laterality Date  . EYE SURGERY Bilateral 1999   cataract  . TRANSURETHRAL RESECTION OF BLADDER TUMOR WITH GYRUS (TURBT-GYRUS)  12/30/2016  . TRANSURETHRAL RESECTION OF PROSTATE  06/16/2017       Home Medications    Prior to Admission medications   Medication Sig Start Date End Date Taking? Authorizing Provider  alfuzosin (UROXATRAL) 10 MG 24 hr tablet Take 10 mg by mouth daily with breakfast.   Yes [provider]  amoxicillin-clavulanate  (AUGMENTIN) 875-125 MG tablet Take 1 tablet by mouth 2 (two) times daily. Take with food 03/28/18   Kandra Nicolas, MD  ampicillin (PRINCIPEN) 500 MG capsule Take 1 capsule (500 mg total) by mouth daily. 02/09/18   Trixie Dredge, PA-C  aspirin EC 81 MG tablet Take 81 mg by mouth daily.    [provider]  clindamycin (CLEOCIN T) 1 % SWAB Apply topically two times daily 02/23/18   Trixie Dredge, PA-C  phenazopyridine (PYRIDIUM) 200 MG tablet Take by mouth.    [provider]    Family History No family history on file.  Social History Social History   Tobacco Use  . Smoking status: Former Smoker    Packs/day: 1.00    Years: 27.00    Pack years: 27.00    Types: Cigarettes    Last attempt to quit: 08/10/2000    Years since quitting: 17.6  . Smokeless tobacco: Never Used  Substance Use Topics  . Alcohol use: Yes  . Drug use: Not on file     Allergies   Oxytetracycline   Review of Systems Review of Systems  Constitutional: Positive for chills, fatigue and fever. Negative for activity change, appetite change and diaphoresis.  HENT: Positive for ear pain and facial swelling. Negative for congestion, sore throat and trouble swallowing.   Eyes: Negative.   Cardiovascular: Negative.   Gastrointestinal: Negative.   Genitourinary: Negative.   Musculoskeletal: Negative.   Skin: Negative.   Neurological: Negative for headaches.     Physical Exam  Triage Vital Signs ED Triage Vitals  Enc Vitals Group     BP 03/28/18 1135 136/88     Pulse Rate 03/28/18 1135 85     Resp 03/28/18 1135 16     Temp 03/28/18 1135 98 F (36.7 C)     Temp Source 03/28/18 1135 Oral     SpO2 03/28/18 1135 97 %     Weight 03/28/18 1137 200 lb (90.7 kg)     Height 03/28/18 1137 5\' 10"  (1.778 m)     Head Circumference --      Peak Flow --      Pain Score 03/28/18 1136 1     Pain Loc --      Pain Edu? --      Excl. in [place]? --    No data found.  Updated  Vital Signs BP 136/88 (BP Location: Right Arm)   Pulse 85   Temp 98 F (36.7 C) (Oral)   Resp 16   Ht 5\' 10"  (1.778 m)   Wt 90.7 kg   SpO2 97%   BMI 28.70 kg/m   Visual Acuity Right Eye Distance:   Left Eye Distance:   Bilateral Distance:    Right Eye Near:   Left Eye Near:    Bilateral Near:     Physical Exam  Constitutional: He appears well-developed and well-nourished. No distress.  HENT:  Head:    Right Ear: Tympanic membrane, external ear and ear canal normal. No mastoid tenderness.  Left Ear: Tympanic membrane, external ear and ear canal normal. No mastoid tenderness.  Nose: Nose normal.  Mouth/Throat: Oropharynx is clear and moist.  Bilateral parotid gland swelling and mild tenderness, more pronounced on the right  Eyes: Pupils are equal, round, and reactive to light. Conjunctivae and EOM are normal.  Neck: Neck supple.  Cardiovascular: Normal heart sounds.  Pulmonary/Chest: Breath sounds normal.  Lymphadenopathy:    He has no cervical adenopathy.  Neurological: He is alert.  Skin: Skin is warm and dry.  Nursing note and vitals reviewed.    UC Treatments / Results  Labs (all labs ordered are listed, but only abnormal results are displayed) Labs Reviewed  MUMPS ANTIBODY, IGG  MUMPS ANTIBODY, IGM  POCT CBC W AUTO DIFF ([facility]):  WBC 5.5; LY 41.0; MO 14.0; GR 45.0; Hgb 15.6; Platelets 194     EKG None  Radiology No results found.  Procedures Procedures (including critical care time)  Medications Ordered in UC Medications - No data to display  Initial Impression / Assessment and Plan / UC Course  I have reviewed the triage vital signs and the nursing notes.  Pertinent labs & imaging results that were available during my care of the patient were reviewed by me and considered in my medical decision making (see chart for details).    Begin empiric Augmentin. Check mumps antibodies. Followup with ENT if not improved about 4 to 5  days.  Final Clinical Impressions(s) / UC Diagnoses   Final diagnoses:  Acute parotitis     Discharge Instructions       Apply warm compresses to the affected area 3 or 4 times daily.  Gently massage the parotid glands 3 or 4 times daily.  Drink enough fluid to keep your urine clear or pale yellow.  Try sucking on sour candy. This may help to make your mouth less dry and may stimulate the flow of saliva.      ED Prescriptions    Medication Sig  Dispense Auth. Provider   amoxicillin-clavulanate (AUGMENTIN) 875-125 MG tablet Take 1 tablet by mouth 2 (two) times daily. Take with food 14 tablet Kandra Nicolas, MD        Kandra Nicolas, MD 04/03/18 [phone]

## 2018-03-28 NOTE — ED Triage Notes (Signed)
Patient reports bilateral TMJ area pain during night starting 3 days ago; feels it is accompanied by mild edema; gets better as day progresses.

## 2018-03-28 NOTE — Discharge Instructions (Addendum)
Apply warm compresses to the affected area 3 or 4 times daily. Gently massage the parotid glands 3 or 4 times daily. Drink enough fluid to keep your urine clear or pale yellow. Try sucking on sour candy. This may help to make your mouth less dry and may stimulate the flow of saliva.

## 2018-04-03 LAB — MUMPS ANTIBODY, IGM: Mumps IgM Value: <1:20 {titer}

## 2018-04-03 LAB — MUMPS ANTIBODY, IGG

## 2018-04-04 ENCOUNTER — Telehealth: Payer: Self-pay | Admitting: Emergency Medicine

## 2018-04-04 NOTE — Telephone Encounter (Signed)
Patient informed of his results.  States that his sxs went away after a couple of days of being here.  Patient will follow up as needed.

## 2018-04-07 ENCOUNTER — Ambulatory Visit (INDEPENDENT_AMBULATORY_CARE_PROVIDER_SITE_OTHER): Payer: Medicare Other | Admitting: Physician Assistant

## 2018-04-07 ENCOUNTER — Encounter: Payer: Self-pay | Admitting: Physician Assistant

## 2018-04-07 VITALS — BP 137/80 | HR 90 | Wt 215.0 lb

## 2018-04-07 DIAGNOSIS — Z87898 Personal history of other specified conditions: Secondary | ICD-10-CM | POA: Diagnosis not present

## 2018-04-07 DIAGNOSIS — I951 Orthostatic hypotension: Secondary | ICD-10-CM

## 2018-04-07 DIAGNOSIS — R42 Dizziness and giddiness: Secondary | ICD-10-CM | POA: Diagnosis not present

## 2018-04-07 DIAGNOSIS — R2689 Other abnormalities of gait and mobility: Secondary | ICD-10-CM

## 2018-04-07 DIAGNOSIS — H6122 Impacted cerumen, left ear: Secondary | ICD-10-CM | POA: Diagnosis not present

## 2018-04-07 NOTE — Progress Notes (Signed)
HPI:                                                                Blake Rice is a 67 y.o. male who presents to [facility]: [facility] today for recurrent lightheadedness  Blake Rice is a pleasant 67 yo M with PMH of malignant neoplasm of bladder (papillary urothelial carcinoma) s/p TURBT (Aug 2018) and prostate cancer Gleason 6 s/p TURP (Feb 2019), CVA (2002), PFO, HTN, orthostasis, and history of vertigo who presents today with worsening lightheadedness.  This has been a chronic problem. Symptoms first began over 10 years ago and have been waxing and waning since that time. He was symptom free for an 69-month period from September 2018 up until late summer of this year. Around mid-August of this year symptoms recurred and seem to be gradually worsening.  Symptoms described as "feeling off balance," "unsteadiness," and "near faint sensation" lasting seconds to minutes Episodes occur approx 10 times per day Between episodes he feels normal Sometimes he has an associated mild headache and increased gas/belching He is physically active and runs 2-3 days per week for 3-4 miles. Has noted that he "veers to the left" while running but otherwise denies any gait disturbance, clumsiness, staggering or falls.  Aggravating factors: - Initially worse after aerobic exercise/running  - Worse with position changes, such as getting out of bed at night to urinate.  - Symptoms are always worse when he is on alpha blockers (such as Flomax and Alfuzosin). He was prescribed Alfuzosin 5-6 weeks ago and he discontinued it about 2-3 weeks ago. Reports his symptoms were worse.  - worse when fasting/skipping meals. - states when he gets on an elevator and goes down many flights he feels like "I have the bends" Now episodes seem spontaneous and will occur even at rest.  Relieving factors:  - he feels better standing and walking around during episodes,  - eating and belching  also sometimes helps - mostly they resolve spontaneously  Pertinent PMH He self-reports a history of stroke in 2002 which afffected his right-side and he had residual speech and word-finding difficulties. Reports his gait and balance were not affected. He also self-reports being told he had an inner ear problem as a child. Apart from tinnitus, denies any hearing changes    Prior work-ups: He has been evaluated by neurology and cardiology.  12/27/15 US Carotid - unable to view report, but reported as negative by PCP at the time 01/03/2016 MR Brain w/o contrast Old small infarct left posterior insular cortex and perisylvian region. Minimal chronic microvascular ischemic changes. Otherwise normal MRI of the brain. No acute abnormality. 01/2016 exercise stress - negative 03/2016 [place] vasoreactivity test -  No important focal intracranial veloicty abnormalities were identified. The  PI was mildly increased in several segments including the BA. This may be due  to age, but could also reflect distal intracranial athero or obstruction in  that territory. There was also evaluation of cerebrovascular reserve with inhalation of 5%  CO2 in air, during monitoring of the right, and the left MCA's. There was a 60% increase in mean velocity bilaterally, suggesting normal cerebrovascular  reserve. There was no previous study available for direct comparison.    Past Medical History:  Diagnosis Date  . BPH (benign prostatic hyperplasia) 04/08/2013  . Episodic lightheadedness   . History of CVA (cerebrovascular accident) 12/25/2015  . HTN (hypertension) 10/12/2013  . Irritable bowel syndrome 11/27/2016  . PFO (patent foramen ovale) 12/25/2015  . Rosacea    Past Surgical History:  Procedure Laterality Date  . EYE SURGERY Bilateral 1999   cataract  . TRANSURETHRAL RESECTION OF BLADDER TUMOR WITH GYRUS (TURBT-GYRUS)  12/30/2016  . TRANSURETHRAL RESECTION OF PROSTATE  06/16/2017   Social History   Tobacco  Use  . Smoking status: Former Smoker    Packs/day: 1.00    Years: 27.00    Pack years: 27.00    Types: Cigarettes    Last attempt to quit: 08/10/2000    Years since quitting: 17.6  . Smokeless tobacco: Never Used  Substance Use Topics  . Alcohol use: Yes   family history is not on file.    ROS: Review of Systems  HENT: Positive for tinnitus (occasional). Negative for hearing loss.   Eyes: Negative for blurred vision and double vision.       + floaters  Gastrointestinal:       + IBS  Genitourinary: Positive for frequency and urgency.  Musculoskeletal: Negative for falls.  Neurological: Positive for headaches. Negative for dizziness, tingling, sensory change, focal weakness, seizures and loss of consciousness.  Psychiatric/Behavioral: The patient is nervous/anxious.   All other systems reviewed and are negative.    Medications: Current Outpatient Medications  Medication Sig Dispense Refill  . ampicillin (PRINCIPEN) 500 MG capsule Take 1 capsule (500 mg total) by mouth daily. 90 capsule 3  . aspirin EC 81 MG tablet Take 81 mg by mouth daily.    . clindamycin (CLEOCIN T) 1 % SWAB Apply topically two times daily 60 Package 0   No current facility-administered medications for this visit.    Allergies  Allergen Reactions  . Oxytetracycline Other (See Comments)    Unknown FOUND WITH ALLERGY SKIN TEST AT AGE OF 6       Objective:  BP 137/80   Pulse 90   Wt 215 lb (97.5 kg)   BMI 30.85 kg/m  Gen:  alert, not ill-appearing, no distress, appropriate for age 68: head normocephalic without obvious abnormality, conjunctiva and cornea clear, right TM obstructed by cerumen, left TM pearly gray and semi-transparent, hearing grossly intact, trachea midline Pulm: Normal work of breathing, normal phonation, clear to auscultation bilaterally, no wheezes, rales or rhonchi CV: Normal rate, regular rhythm, s1 and s2 distinct, no murmurs, clicks or rubs  Neuro: alert and oriented x  3 MSK: extremities atraumatic, normal gait and station Skin: intact, no rashes on exposed skin, no jaundice, no cyanosis   No data found.    No results found for this or any previous visit (from the past 72 hour(s)). No results found.    Assessment and Plan: 67 y.o. male with   .Blake Rice was seen today for dizziness.  Diagnoses and all orders for this visit:  Orthostasis  Episodic lightheadedness -     Ambulatory referral to ENT -     Ambulatory referral to Psychology  History of vertigo -     Ambulatory referral to ENT -     Ambulatory referral to Physical Therapy -     Ambulatory referral to Psychology  Imbalance -     Ambulatory referral to Physical Therapy  Impacted cerumen of left ear   Chronic lightheadedness that is worse with exercise/exertion and exacerbated by use of  alpha blockers Again patient demonstrated orthostasis on exam today with SBP drop of 20 mmHg from sitting to standing He became symptomatic shortly after performing orthostatics He has not been taking alpha blockers for over 1 month and has been symptomatic in absence of alpha blockers before, so this is more an exacerbating factor than the underlying cause At this point, autonomic dysfunction is highest on my differential and consultation with Neurology is necessary to have Araceli Bouche evaluated for Pure Autonomic Dysfunction v MSA  Symptoms are causing significant distress. Patient reports his quality of life is impacted more so by his lightheadedness than his recent cancer diagnoses I will refer him to PT for vestibular rehab  I have also referred him to a neuropsychologist for biofeedback  Noted left cerumen impaction on exam today. Patient declined irrigation due to concern for worsening dizziness/imbalance  Patient education and anticipatory guidance given Patient agrees with treatment plan Follow-up as needed if symptoms worsen or fail to improve  Darlyne Russian PA-C

## 2018-04-08 DIAGNOSIS — C679 Malignant neoplasm of bladder, unspecified: Secondary | ICD-10-CM | POA: Diagnosis not present

## 2018-04-08 DIAGNOSIS — C678 Malignant neoplasm of overlapping sites of bladder: Secondary | ICD-10-CM | POA: Diagnosis not present

## 2018-04-08 DIAGNOSIS — C61 Malignant neoplasm of prostate: Secondary | ICD-10-CM | POA: Diagnosis not present

## 2018-04-09 ENCOUNTER — Encounter: Payer: Self-pay | Admitting: Physician Assistant

## 2018-04-12 DIAGNOSIS — Z79899 Other long term (current) drug therapy: Secondary | ICD-10-CM | POA: Diagnosis not present

## 2018-04-12 DIAGNOSIS — K5909 Other constipation: Secondary | ICD-10-CM | POA: Diagnosis not present

## 2018-04-12 DIAGNOSIS — Z87891 Personal history of nicotine dependence: Secondary | ICD-10-CM | POA: Diagnosis not present

## 2018-04-12 DIAGNOSIS — Z7982 Long term (current) use of aspirin: Secondary | ICD-10-CM | POA: Diagnosis not present

## 2018-04-12 DIAGNOSIS — K589 Irritable bowel syndrome without diarrhea: Secondary | ICD-10-CM | POA: Diagnosis not present

## 2018-04-12 DIAGNOSIS — R000 Tachycardia, unspecified: Secondary | ICD-10-CM | POA: Diagnosis not present

## 2018-04-14 ENCOUNTER — Encounter: Payer: Self-pay | Admitting: Physician Assistant

## 2018-04-14 DIAGNOSIS — I951 Orthostatic hypotension: Secondary | ICD-10-CM

## 2018-04-14 DIAGNOSIS — R42 Dizziness and giddiness: Secondary | ICD-10-CM

## 2018-04-14 DIAGNOSIS — Z1211 Encounter for screening for malignant neoplasm of colon: Secondary | ICD-10-CM | POA: Diagnosis not present

## 2018-04-16 DIAGNOSIS — R42 Dizziness and giddiness: Secondary | ICD-10-CM | POA: Diagnosis not present

## 2018-04-18 LAB — COLOGUARD: COLOGUARD: NEGATIVE

## 2018-04-20 ENCOUNTER — Telehealth: Payer: Self-pay | Admitting: Physician Assistant

## 2018-04-20 NOTE — Telephone Encounter (Signed)
Left VM for Pt to return clinic call regarding results, callback information provided. 

## 2018-04-20 NOTE — Telephone Encounter (Signed)
Cologuard negative Repeat screening in 3 years 

## 2018-04-20 NOTE — Telephone Encounter (Signed)
Patient advised of results and recommendations.  

## 2018-04-23 ENCOUNTER — Encounter: Payer: Self-pay | Admitting: Physician Assistant

## 2018-05-03 ENCOUNTER — Encounter: Payer: Self-pay | Admitting: Physician Assistant

## 2018-05-03 DIAGNOSIS — K589 Irritable bowel syndrome without diarrhea: Secondary | ICD-10-CM

## 2018-05-03 DIAGNOSIS — F419 Anxiety disorder, unspecified: Secondary | ICD-10-CM

## 2018-05-04 MED ORDER — NORTRIPTYLINE HCL 25 MG PO CAPS: ORAL_CAPSULE | ORAL | 1 refills | Status: DC

## 2018-05-13 DIAGNOSIS — Z5111 Encounter for antineoplastic chemotherapy: Secondary | ICD-10-CM | POA: Diagnosis not present

## 2018-05-13 DIAGNOSIS — C679 Malignant neoplasm of bladder, unspecified: Secondary | ICD-10-CM | POA: Diagnosis not present

## 2018-05-13 DIAGNOSIS — C678 Malignant neoplasm of overlapping sites of bladder: Secondary | ICD-10-CM | POA: Diagnosis not present

## 2018-05-20 DIAGNOSIS — Z5111 Encounter for antineoplastic chemotherapy: Secondary | ICD-10-CM | POA: Diagnosis not present

## 2018-05-20 DIAGNOSIS — C678 Malignant neoplasm of overlapping sites of bladder: Secondary | ICD-10-CM | POA: Diagnosis not present

## 2018-05-20 DIAGNOSIS — C679 Malignant neoplasm of bladder, unspecified: Secondary | ICD-10-CM | POA: Diagnosis not present

## 2018-05-27 DIAGNOSIS — C678 Malignant neoplasm of overlapping sites of bladder: Secondary | ICD-10-CM | POA: Diagnosis not present

## 2018-05-27 DIAGNOSIS — C679 Malignant neoplasm of bladder, unspecified: Secondary | ICD-10-CM | POA: Diagnosis not present

## 2018-05-27 DIAGNOSIS — Z5111 Encounter for antineoplastic chemotherapy: Secondary | ICD-10-CM | POA: Diagnosis not present

## 2018-06-24 ENCOUNTER — Encounter: Payer: Self-pay | Admitting: Physician Assistant

## 2018-06-26 ENCOUNTER — Encounter: Payer: Self-pay | Admitting: Physician Assistant

## 2018-06-26 ENCOUNTER — Ambulatory Visit (INDEPENDENT_AMBULATORY_CARE_PROVIDER_SITE_OTHER): Payer: Medicare Other | Admitting: Physician Assistant

## 2018-06-26 VITALS — BP 174/85 | HR 97 | Wt 217.0 lb

## 2018-06-26 DIAGNOSIS — I10 Essential (primary) hypertension: Secondary | ICD-10-CM

## 2018-06-26 DIAGNOSIS — K581 Irritable bowel syndrome with constipation: Secondary | ICD-10-CM

## 2018-06-26 DIAGNOSIS — F419 Anxiety disorder, unspecified: Secondary | ICD-10-CM | POA: Diagnosis not present

## 2018-06-26 NOTE — Patient Instructions (Addendum)
Monitor and log your blood pressures at home for the next week  Take a capful of Miralax daily and stool softener twice a day until you feel that you are stooling normally   For your blood pressure: - Goal <130/80 (Ideally 120's/70's) - monitor and log blood pressures at home - check around the same time each day in a relaxed setting - Limit salt to <2500 mg/day - Follow DASH (Dietary Approach to Stopping Hypertension) eating plan - Try to get at least 150 minutes of aerobic exercise per week - Aim to go on a brisk walk 30 minutes per day at least 5 days per week. If you're not active, gradually increase how long you walk by 5 minutes each week - limit alcohol: 2 standard drinks per day for men and 1 per day for women - avoid tobacco/nicotine products. Consider smoking cessation if you smoke - weight loss: 7% of current body weight can reduce your blood pressure by 5-10 points - follow-up at least every 6 months for your blood pressure. Follow-up sooner if your BP is not controlled

## 2018-06-26 NOTE — Progress Notes (Signed)
HPI:                                                                Blake Rice is a 68 y.o. male who presents to [facility] today for follow-up  Elevated blood pressure reading today. Patient endorses lightheadedness today similar to his prior episodes of lightheadedness. He did take Dulcolax today. Recent BP readings at outpatient office visits as follows 158/86 05/27/18 132/84 05/20/18 130/78 05/13/18 179/91 04/16/18  1. Constipation - has been taking Nortriptyline for anxiety and IBS and reports this caused constipation where he was not having a BM but every 2-3 days.  He stopped the medication 2 weeks ago and states he has been taking multiple OTC medications including Miralax, fleet enema, and dulcolax. States only Dulcolax was effective. He had a BM today but reports it was small and not satisfying. Denies fever, abdominal pain, vomiting, melena/hematochezia.  2. Orthostasis - has an appt with [facility] in March.  Has not had symptoms since Jan 12th, which correlates with his chemotherapy. He was able to get back to running and feeling like his old self. He feels like symptoms are gradually coming back beginning last night with blurred vision and lightheadedness.  3. Bladder cancer - currently on Gemzar bladder instillation every 6 months.   Depression screen [facility] 2/9 06/26/2018 02/03/2018  Decreased Interest 0 0  Down, Depressed, Hopeless 0 0  PHQ - 2 Score 0 0    No flowsheet data found.    Past Medical History:  Diagnosis Date  . BPH (benign prostatic hyperplasia) 04/08/2013  . Episodic lightheadedness   . History of CVA (cerebrovascular accident) 12/25/2015  . HTN (hypertension) 10/12/2013  . Irritable bowel syndrome 11/27/2016  . PFO (patent foramen ovale) 12/25/2015  . Rosacea    Past Surgical History:  Procedure Laterality Date  . EYE SURGERY Bilateral 1999   cataract  . TRANSURETHRAL RESECTION OF BLADDER TUMOR WITH GYRUS  (TURBT-GYRUS)  12/30/2016  . TRANSURETHRAL RESECTION OF PROSTATE  06/16/2017   Social History   Tobacco Use  . Smoking status: Former Smoker    Packs/day: 1.00    Years: 27.00    Pack years: 27.00    Types: Cigarettes    Last attempt to quit: 08/10/2000    Years since quitting: 17.8  . Smokeless tobacco: Never Used  Substance Use Topics  . Alcohol use: Yes   family history is not on file.    ROS: negative except as noted in the HPI  Medications: Current Outpatient Medications  Medication Sig Dispense Refill  . ampicillin (PRINCIPEN) 500 MG capsule Take 1 capsule (500 mg total) by mouth daily. 90 capsule 3  . aspirin EC 81 MG tablet Take 81 mg by mouth daily.    . clindamycin (CLEOCIN T) 1 % SWAB Apply topically two times daily 60 Package 0  . irbesartan (AVAPRO) 75 MG tablet Take 1 tablet (75 mg total) by mouth daily. 90 tablet 0   No current facility-administered medications for this visit.    Allergies  Allergen Reactions  . Oxytetracycline Other (See Comments)    Unknown FOUND WITH ALLERGY SKIN TEST AT AGE OF 6       Objective:  BP (!) 174/85  Pulse 97   Wt 217 lb (98.4 kg)   BMI 31.14 kg/m  Gen:  alert, not ill-appearing, no distress, appropriate for age, obese male HEENT: head normocephalic without obvious abnormality, conjunctiva and cornea clear, trachea midline Pulm: Normal work of breathing, normal phonation, clear to auscultation bilaterally, no wheezes, rales or rhonchi CV: Normal rate, regular rhythm, s1 and s2 distinct, no murmurs, clicks or rubs  Neuro: alert and oriented x 3, no tremor MSK: extremities atraumatic, normal gait and station Skin: intact, no rashes on exposed skin, no jaundice, no cyanosis   BP Readings from Last 3 Encounters:  06/26/18 (!) 174/85  04/07/18 137/80  03/28/18 136/88     No results found for this or any previous visit (from the past 86 hour(s)). No results found.    Assessment and Plan: 68 y.o. male with    .Diagnoses and all orders for this visit:  Elevated blood pressure reading in office with diagnosis of hypertension -     Renal Profile with Estimated GFR -     Metanephrines, plasma -     TSH + free T4  Irritable bowel syndrome with constipation  Anxiety disorder, unspecified type   Elevated BP BP in stage 2 hypertensive range in office today associated with dizziness. He has a history of orthostasis and has dizziness/lightheadedness with normotensive BP's as well BP was documented 179/91 at neurology appt on 04/16/18 Patient deferred antihypertensive medication and prefers to monitor his BP at home Will avoid CCB and thiazide due to his orthostasis. ACE/ARB would be best option for treating his hypertenion Renal function, TSH and metanephrines pending  IBS-C Worsening symptoms with Amitryptiline. Self-discontinued Stop Dulcolax Cont capful Miralax and stool softener twice a day Drink at least 64 ounces of water daily  Patient education and anticipatory guidance given Patient agrees with treatment plan Follow-up in 1 month or sooner as needed if symptoms worsen or fail to improve  I spent 25 minutes with this patient, greater than 50% was face-to-face time counseling regarding the above diagnoses  Darlyne Russian PA-C

## 2018-06-29 ENCOUNTER — Encounter: Payer: Self-pay | Admitting: Physician Assistant

## 2018-06-29 LAB — RENAL PROFILE WITH ESTIMATED GFR
Albumin: 4.3 g/dL (ref 3.6–5.1)
BUN: 15 mg/dL (ref 7–25)
CALCIUM: 9.5 mg/dL (ref 8.6–10.3)
CHLORIDE: 103 mmol/L (ref 98–110)
CO2: 28 mmol/L (ref 20–32)
Creat: 1.16 mg/dL (ref 0.70–1.25)
GFR, Est African American: 76 mL/min/{1.73_m2} (ref >=60)
GFR, Est Non African American: 66 mL/min/{1.73_m2} (ref >=60)
GLUCOSE: 110 mg/dL (ref 65–139)
PHOSPHORUS: 2.5 mg/dL (ref 2.1–4.3)
POTASSIUM: 4.3 mmol/L (ref 3.5–5.3)
SODIUM: 139 mmol/L (ref 135–146)

## 2018-06-29 LAB — TSH+FREE T4: TSH W/REFLEX TO FT4: 0.79 m[IU]/L (ref 0.40–4.50)

## 2018-06-29 LAB — METANEPHRINES, PLASMA
METANEPHRINE FREE: 41 pg/mL (ref <=57)
NORMETANEPHRINE FREE: 162 pg/mL — AB (ref <=148)
Total Metanephrines-Plasma: 203 pg/mL (ref <=205)

## 2018-06-30 ENCOUNTER — Encounter: Payer: Self-pay | Admitting: Physician Assistant

## 2018-06-30 DIAGNOSIS — N182 Chronic kidney disease, stage 2 (mild): Secondary | ICD-10-CM

## 2018-06-30 HISTORY — DX: Chronic kidney disease, stage 2 (mild): N18.2

## 2018-06-30 MED ORDER — IRBESARTAN 75 MG PO TABS: 75.0000 mg | ORAL_TABLET | Freq: Every day | ORAL | 0 refills | Status: DC

## 2018-07-16 DIAGNOSIS — C679 Malignant neoplasm of bladder, unspecified: Secondary | ICD-10-CM | POA: Diagnosis not present

## 2018-07-16 DIAGNOSIS — R3912 Poor urinary stream: Secondary | ICD-10-CM | POA: Diagnosis not present

## 2018-07-16 DIAGNOSIS — I951 Orthostatic hypotension: Secondary | ICD-10-CM | POA: Diagnosis not present

## 2018-07-16 DIAGNOSIS — R350 Frequency of micturition: Secondary | ICD-10-CM | POA: Diagnosis not present

## 2018-07-16 DIAGNOSIS — G901 Familial dysautonomia [Riley-Day]: Secondary | ICD-10-CM | POA: Diagnosis not present

## 2018-07-16 DIAGNOSIS — N401 Enlarged prostate with lower urinary tract symptoms: Secondary | ICD-10-CM | POA: Diagnosis not present

## 2018-07-16 DIAGNOSIS — Z87891 Personal history of nicotine dependence: Secondary | ICD-10-CM | POA: Diagnosis not present

## 2018-07-16 DIAGNOSIS — R42 Dizziness and giddiness: Secondary | ICD-10-CM | POA: Diagnosis not present

## 2018-07-20 ENCOUNTER — Encounter: Payer: Self-pay | Admitting: Physician Assistant

## 2018-07-22 NOTE — Telephone Encounter (Signed)
Please contact patient and confirm that he will re-schedule his appointment. He is still on schedule for Friday. Recommend rescheduling for May 2020

## 2018-07-23 ENCOUNTER — Encounter: Payer: Self-pay | Admitting: Physician Assistant

## 2018-07-24 ENCOUNTER — Ambulatory Visit: Payer: Medicare Other | Admitting: Physician Assistant

## 2018-07-29 DIAGNOSIS — Z7982 Long term (current) use of aspirin: Secondary | ICD-10-CM | POA: Diagnosis not present

## 2018-07-29 DIAGNOSIS — Z87891 Personal history of nicotine dependence: Secondary | ICD-10-CM | POA: Diagnosis not present

## 2018-07-29 DIAGNOSIS — C679 Malignant neoplasm of bladder, unspecified: Secondary | ICD-10-CM | POA: Diagnosis not present

## 2018-07-29 DIAGNOSIS — F419 Anxiety disorder, unspecified: Secondary | ICD-10-CM | POA: Diagnosis not present

## 2018-07-29 DIAGNOSIS — C61 Malignant neoplasm of prostate: Secondary | ICD-10-CM | POA: Diagnosis not present

## 2018-07-29 DIAGNOSIS — I10 Essential (primary) hypertension: Secondary | ICD-10-CM | POA: Diagnosis not present

## 2018-07-29 DIAGNOSIS — Z8673 Personal history of transient ischemic attack (TIA), and cerebral infarction without residual deficits: Secondary | ICD-10-CM | POA: Diagnosis not present

## 2018-07-29 DIAGNOSIS — Z881 Allergy status to other antibiotic agents status: Secondary | ICD-10-CM | POA: Diagnosis not present

## 2018-07-29 DIAGNOSIS — Z79899 Other long term (current) drug therapy: Secondary | ICD-10-CM | POA: Diagnosis not present

## 2018-08-18 ENCOUNTER — Other Ambulatory Visit: Payer: Self-pay | Admitting: Physician Assistant

## 2018-08-25 ENCOUNTER — Other Ambulatory Visit: Payer: Self-pay | Admitting: Physician Assistant

## 2018-08-25 DIAGNOSIS — L719 Rosacea, unspecified: Secondary | ICD-10-CM

## 2018-08-25 MED ORDER — CLINDAMYCIN PHOSPHATE 1 % EX GEL: Freq: Two times a day (BID) | CUTANEOUS | 3 refills | Status: DC

## 2018-09-14 ENCOUNTER — Encounter: Payer: Self-pay | Admitting: Physician Assistant

## 2018-09-14 DIAGNOSIS — G901 Familial dysautonomia [Riley-Day]: Secondary | ICD-10-CM

## 2018-09-14 HISTORY — DX: Familial dysautonomia (riley-day): G90.1

## 2018-09-15 ENCOUNTER — Other Ambulatory Visit: Payer: Self-pay

## 2018-09-15 ENCOUNTER — Encounter: Payer: Self-pay | Admitting: Physician Assistant

## 2018-09-15 ENCOUNTER — Ambulatory Visit (INDEPENDENT_AMBULATORY_CARE_PROVIDER_SITE_OTHER): Payer: Medicare Other | Admitting: Physician Assistant

## 2018-09-15 VITALS — BP 147/95 | HR 97 | Temp 98.6°F | Wt 208.0 lb

## 2018-09-15 DIAGNOSIS — G901 Familial dysautonomia [Riley-Day]: Secondary | ICD-10-CM | POA: Diagnosis not present

## 2018-09-15 DIAGNOSIS — N182 Chronic kidney disease, stage 2 (mild): Secondary | ICD-10-CM | POA: Diagnosis not present

## 2018-09-15 DIAGNOSIS — I10 Essential (primary) hypertension: Secondary | ICD-10-CM

## 2018-09-15 DIAGNOSIS — K5909 Other constipation: Secondary | ICD-10-CM | POA: Diagnosis not present

## 2018-09-15 MED ORDER — IRBESARTAN 150 MG PO TABS: 150.0000 mg | ORAL_TABLET | Freq: Every day | ORAL | 0 refills | Status: DC

## 2018-09-15 NOTE — Progress Notes (Signed)
HPI:                                                                Blake Rice is a 68 y.o. male who presents to [facility] today for HTN  HTN: taking Irebsartan 75 mg nightly. Compliant with medications. Checks BP's at home. BP range 140's/mid-80's. He is running 4 miles/day and following a low sodium diet. He would like to increase his dose. Denies headache, chest pain with exertion, orthopnea, lightheadedness, syncope and edema. Risk factors include: male sex, age>55  Dysautonomia: Blake Rice suffers from episodes of symptomatic orthostasis, constipation, urinary retention and blurred vision for several years. Interestingly, while being treated for bladder cancer with Gemzar, he became symptom-free. 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 He has an upcoming appt at [facility] with a Neuro-immunologist on October 19, 2018.  Constipation: having a BM daily with occ. Use of OTC constipation medications  Past Medical History:  Diagnosis Date  . BPH (benign prostatic hyperplasia) 04/08/2013  . Chronic kidney disease, stage 2, mildly decreased GFR 06/30/2018  . Dysautonomia (Blake Rice) 09/14/2018   Striational antibody 1:960   . Dysautonomia (Blake Rice)   . Episodic lightheadedness   . History of CVA (cerebrovascular accident) 12/25/2015  . HTN (hypertension) 10/12/2013  . Irritable bowel syndrome 11/27/2016  . PFO (patent foramen ovale) 12/25/2015  . Rosacea    Past Surgical History:  Procedure Laterality Date  . EYE SURGERY Bilateral 1999   cataract  . TRANSURETHRAL RESECTION OF BLADDER TUMOR WITH GYRUS (TURBT-GYRUS)  12/30/2016  . TRANSURETHRAL RESECTION OF PROSTATE  06/16/2017   Social History   Tobacco Use  . Smoking status: Former Smoker    Packs/day: 1.00    Years: 27.00    Pack years: 27.00    Types: Cigarettes    Last attempt to quit: 08/10/2000    Years since quitting: 18.1  . Smokeless tobacco:  Never Used  Substance Use Topics  . Alcohol use: Not Currently   family history includes Hypertension in his father.   Review of Systems  Eyes:       + blurred vision  Gastrointestinal: Positive for constipation.  Genitourinary: Positive for difficulty urinating.       LUTS  Neurological: Positive for dizziness. Negative for syncope.  All other systems reviewed and are negative.    Medications: Current Outpatient Medications  Medication Sig Dispense Refill  . ampicillin (PRINCIPEN) 500 MG capsule Take 1 capsule (500 mg total) by mouth daily. 90 capsule 3  . aspirin EC 81 MG tablet Take 81 mg by mouth daily.    . irbesartan (AVAPRO) 150 MG tablet Take 1 tablet (150 mg total) by mouth daily. 90 tablet 0   No current facility-administered medications for this visit.    Allergies  Allergen Reactions  . Oxytetracycline Other (See Comments)    Unknown FOUND WITH ALLERGY SKIN TEST AT AGE OF 6       Objective:  BP (!) 147/95   Pulse 97   Temp 98.6 F (37 C) (Oral)   Wt 208 lb (94.3 kg)   BMI 29.84 kg/m  Gen:  alert, not ill-appearing, no distress, appropriate for age HEENT: head normocephalic without obvious abnormality, conjunctiva and cornea  clear, trachea midline Pulm: Normal work of breathing, normal phonation Neuro: alert and oriented x 3, no tremor MSK: extremities atraumatic, normal gait and station Skin: intact, no rashes on exposed skin, no jaundice, no cyanosis Psych: well-groomed, cooperative, good eye contact, euthymic mood, affect mood-congruent, speech is articulate, and thought processes clear and goal-directed  Lab Results  Component Value Date   CREATININE 1.16 06/26/2018   BUN 15 06/26/2018   NA 139 06/26/2018   K 4.3 06/26/2018   CL 103 06/26/2018   CO2 28 06/26/2018   Lab Results  Component Value Date   HGBA1C 5.4 02/03/2018      Assessment and Plan: 68 y.o. male with   .Blake Rice was seen today for hypertension.  Diagnoses and all  orders for this visit:  Hypertension goal BP (blood pressure) < 130/80 -     irbesartan (AVAPRO) 150 MG tablet; Take 1 tablet (150 mg total) by mouth daily.  Dysautonomia (HCC)  Chronic constipation  Chronic kidney disease, stage 2, mildly decreased GFR  BP goal <130/80 BP mildly out of range in office today and at home readings. Asympatomatic. Patient is physically active with no change in exercise tolerance His dysautonomia poses an added challenge of managing his HTN since he is both hypertensive and has orthostasis Increasing Irbesartan to 150 mg Continue to monitor BP at home and reduce dose back to 75 mg if there is worsening orthostasis or hypotension Repeat renal function in 2 weeks  Patient education and anticipatory guidance given Patient agrees with treatment plan Follow-up in 3 months or sooner as needed if symptoms worsen or fail to improve  Darlyne Russian PA-C

## 2018-09-16 ENCOUNTER — Encounter: Payer: Self-pay | Admitting: Physician Assistant

## 2018-09-16 DIAGNOSIS — K5909 Other constipation: Secondary | ICD-10-CM | POA: Insufficient documentation

## 2018-10-19 DIAGNOSIS — G988 Other disorders of nervous system: Secondary | ICD-10-CM | POA: Diagnosis not present

## 2018-10-19 DIAGNOSIS — C801 Malignant (primary) neoplasm, unspecified: Secondary | ICD-10-CM | POA: Diagnosis not present

## 2018-10-19 DIAGNOSIS — C673 Malignant neoplasm of anterior wall of bladder: Secondary | ICD-10-CM | POA: Diagnosis not present

## 2018-10-20 ENCOUNTER — Encounter: Payer: Self-pay | Admitting: Physician Assistant

## 2018-10-27 DIAGNOSIS — C61 Malignant neoplasm of prostate: Secondary | ICD-10-CM | POA: Diagnosis not present

## 2018-10-27 DIAGNOSIS — C679 Malignant neoplasm of bladder, unspecified: Secondary | ICD-10-CM | POA: Diagnosis not present

## 2018-11-04 DIAGNOSIS — Z5111 Encounter for antineoplastic chemotherapy: Secondary | ICD-10-CM | POA: Diagnosis not present

## 2018-11-04 DIAGNOSIS — C679 Malignant neoplasm of bladder, unspecified: Secondary | ICD-10-CM | POA: Diagnosis not present

## 2018-11-04 DIAGNOSIS — D494 Neoplasm of unspecified behavior of bladder: Secondary | ICD-10-CM | POA: Diagnosis not present

## 2018-11-10 DIAGNOSIS — G988 Other disorders of nervous system: Secondary | ICD-10-CM | POA: Diagnosis not present

## 2018-11-10 DIAGNOSIS — K118 Other diseases of salivary glands: Secondary | ICD-10-CM | POA: Diagnosis not present

## 2018-11-10 DIAGNOSIS — C672 Malignant neoplasm of lateral wall of bladder: Secondary | ICD-10-CM | POA: Diagnosis not present

## 2018-11-10 DIAGNOSIS — N200 Calculus of kidney: Secondary | ICD-10-CM | POA: Diagnosis not present

## 2018-11-10 DIAGNOSIS — C673 Malignant neoplasm of anterior wall of bladder: Secondary | ICD-10-CM | POA: Diagnosis not present

## 2018-11-10 DIAGNOSIS — C801 Malignant (primary) neoplasm, unspecified: Secondary | ICD-10-CM | POA: Diagnosis not present

## 2018-11-10 DIAGNOSIS — R590 Localized enlarged lymph nodes: Secondary | ICD-10-CM | POA: Diagnosis not present

## 2018-11-11 DIAGNOSIS — C679 Malignant neoplasm of bladder, unspecified: Secondary | ICD-10-CM | POA: Diagnosis not present

## 2018-11-11 DIAGNOSIS — Z5111 Encounter for antineoplastic chemotherapy: Secondary | ICD-10-CM | POA: Diagnosis not present

## 2018-11-11 DIAGNOSIS — D494 Neoplasm of unspecified behavior of bladder: Secondary | ICD-10-CM | POA: Diagnosis not present

## 2018-11-18 DIAGNOSIS — Z5111 Encounter for antineoplastic chemotherapy: Secondary | ICD-10-CM | POA: Diagnosis not present

## 2018-11-18 DIAGNOSIS — D494 Neoplasm of unspecified behavior of bladder: Secondary | ICD-10-CM | POA: Diagnosis not present

## 2018-11-18 DIAGNOSIS — C679 Malignant neoplasm of bladder, unspecified: Secondary | ICD-10-CM | POA: Diagnosis not present

## 2018-11-24 ENCOUNTER — Encounter: Payer: Self-pay | Admitting: Physician Assistant

## 2018-12-01 ENCOUNTER — Telehealth: Payer: Self-pay

## 2018-12-01 DIAGNOSIS — K118 Other diseases of salivary glands: Secondary | ICD-10-CM

## 2018-12-01 NOTE — Telephone Encounter (Signed)
Called and left voicemail with my cell [phone] If they call back at the office number please provide Dr. Manuella Ghazi with my cell phone

## 2018-12-01 NOTE — Telephone Encounter (Signed)
Ginger with Dr Trena Platt office called and states Dr Manuella Ghazi wanted Evlyn Clines to follow up on an abnormal area on his PET-CT.  Hypermetabolic right parotid nodule   Darlin Priestly, MD Address: [address] [place], [place], [place] [address]  Phone: [phone]  Direct number [phone]  PET-CT Skull Base to [facility] 11/10/2018 [facility] Result Impression  IMPRESSION: 1. Hypermetabolic right parotid nodule  2. Right external iliac adenopathy that is not hypermetabolic  Electronically Signed by: Caryl Never  Result Narrative  INDICATION: Bladder cancer.  TECHNIQUE: 72.8 millicuries of A-06 FDG was administered intravenously. PET imaging was obtained from the skull base to the mid thighs. CT images were obtained for attenuation correction and localization purposes. Glucose level was 99 MG/DL. Time from  injection to scan was 61 minutes. Comparison none.  FINDINGS: Hypermetabolic right parotid nodule on image 37 measuring 12 mm with an SUV Max of 6.2. Image 37.   nonobstructing left renal stone diverticulosis without diverticulitis. Normal appendix. No adenopathy. There is a right external iliac lymph node on image 238 measuring 19 x 13 mm. This lymph node is not hypermetabolic.  Other Result Information  Acute Interface, Incoming Rad Results - 11/10/2018  3:41 PM EDT INDICATION: Bladder cancer.  TECHNIQUE: 01.5 millicuries of I-15 FDG was administered intravenously. PET imaging was obtained from the skull base to the mid thighs. CT images were obtained for attenuation correction and localization purposes. Glucose level was 99 MG/DL. Time from  injection to scan was 61 minutes. Comparison none.  FINDINGS: Hypermetabolic right parotid nodule on image 37 measuring 12 mm with an SUV Max of 6.2. Image 37.   nonobstructing left renal stone diverticulosis without diverticulitis. Normal appendix. No adenopathy. There is a right external iliac lymph node on image 238 measuring 19 x 13 mm.  This lymph node is not hypermetabolic.    IMPRESSION: 1. Hypermetabolic right parotid nodule  2. Right external iliac adenopathy that is not hypermetabolic  Electronically Signed by: Caryl Never

## 2018-12-03 ENCOUNTER — Encounter: Payer: Self-pay | Admitting: Physician Assistant

## 2018-12-03 DIAGNOSIS — K118 Other diseases of salivary glands: Secondary | ICD-10-CM | POA: Insufficient documentation

## 2018-12-03 HISTORY — DX: Other diseases of salivary glands: K11.8

## 2018-12-03 NOTE — Telephone Encounter (Signed)
2nd attempt to reach Dr. Manuella Ghazi without success I reviewed the PET scan and I called and spoke to Araceli Bouche this evening I have referred him to [facility] for possible biopsy of his right parotid gland I instructed patient to request copy of PET from [facility] to bring to his appointment  Jenny Reichmann, please fax the PET scan report with the referral

## 2018-12-04 NOTE — Telephone Encounter (Signed)
Referral sent to [facility] with PET scan I also called and they were calling patient to get him scheduled- CF

## 2018-12-04 NOTE — Telephone Encounter (Signed)
Gave Dr Trena Platt office Charley's cell number.

## 2018-12-07 ENCOUNTER — Encounter: Payer: Self-pay | Admitting: Physician Assistant

## 2018-12-11 DIAGNOSIS — K118 Other diseases of salivary glands: Secondary | ICD-10-CM | POA: Diagnosis not present

## 2018-12-19 ENCOUNTER — Other Ambulatory Visit: Payer: Self-pay | Admitting: Physician Assistant

## 2018-12-19 DIAGNOSIS — I10 Essential (primary) hypertension: Secondary | ICD-10-CM

## 2018-12-22 DIAGNOSIS — K118 Other diseases of salivary glands: Secondary | ICD-10-CM | POA: Diagnosis not present

## 2019-02-26 ENCOUNTER — Other Ambulatory Visit: Payer: Self-pay | Admitting: Physician Assistant

## 2019-02-26 DIAGNOSIS — L719 Rosacea, unspecified: Secondary | ICD-10-CM

## 2019-02-26 DIAGNOSIS — I10 Essential (primary) hypertension: Secondary | ICD-10-CM

## 2019-03-02 DIAGNOSIS — Z961 Presence of intraocular lens: Secondary | ICD-10-CM | POA: Diagnosis not present

## 2019-03-02 DIAGNOSIS — H524 Presbyopia: Secondary | ICD-10-CM | POA: Diagnosis not present

## 2019-03-02 DIAGNOSIS — H53002 Unspecified amblyopia, left eye: Secondary | ICD-10-CM | POA: Diagnosis not present

## 2019-03-02 DIAGNOSIS — H5213 Myopia, bilateral: Secondary | ICD-10-CM | POA: Diagnosis not present

## 2019-03-02 DIAGNOSIS — H43813 Vitreous degeneration, bilateral: Secondary | ICD-10-CM | POA: Diagnosis not present

## 2019-04-17 DIAGNOSIS — Z23 Encounter for immunization: Secondary | ICD-10-CM | POA: Diagnosis not present

## 2019-04-20 ENCOUNTER — Encounter: Payer: Self-pay | Admitting: Osteopathic Medicine

## 2019-04-20 ENCOUNTER — Ambulatory Visit (INDEPENDENT_AMBULATORY_CARE_PROVIDER_SITE_OTHER): Payer: Medicare Other | Admitting: Osteopathic Medicine

## 2019-04-20 VITALS — Wt 210.0 lb

## 2019-04-20 DIAGNOSIS — I10 Essential (primary) hypertension: Secondary | ICD-10-CM | POA: Diagnosis not present

## 2019-04-20 DIAGNOSIS — L719 Rosacea, unspecified: Secondary | ICD-10-CM | POA: Diagnosis not present

## 2019-04-20 DIAGNOSIS — C678 Malignant neoplasm of overlapping sites of bladder: Secondary | ICD-10-CM | POA: Diagnosis not present

## 2019-04-20 DIAGNOSIS — G909 Disorder of the autonomic nervous system, unspecified: Secondary | ICD-10-CM | POA: Diagnosis not present

## 2019-04-20 MED ORDER — IRBESARTAN 150 MG PO TABS: 150.0000 mg | ORAL_TABLET | Freq: Every day | ORAL | 3 refills | Status: DC

## 2019-04-20 MED ORDER — AMPICILLIN 500 MG PO CAPS: 500.0000 mg | ORAL_CAPSULE | Freq: Every day | ORAL | 3 refills | Status: DC

## 2019-04-20 NOTE — Progress Notes (Signed)
Called pt at 1210 pm, no answer. Left a detailed vm msg.

## 2019-04-20 NOTE — Progress Notes (Signed)
Virtual Visit via Phone Note  I connected with      Franz Dell on 04/20/19 at 2:25 PM  by a telemedicine application and verified that I am speaking with the correct person using two identifiers.  Patient is at home I am in office   I discussed the limitations of evaluation and management by telemedicine and the availability of in person appointments. The patient expressed understanding and agreed to proceed.  History of Present Illness: Suresh Mitch is a 68 y.o. male who would like to discuss refill medications, catch up w/ new Dr    Hx Pure Autonomic Failure aka [place], seemed to essentially disappear after previous rounds of chemo but w/ most recent treatment he didn't feel much difference, symptoms as follows:   Blurry vision - saw optometrist few weeks ago on a good day, but the blurred vision comes and goes  Urinary frequency - BPH type symptoms worse in AM  Constipation - was bad for awhile, MiraLax qhs, was able to come off this about a month ago unless as needed  Orthostasis - worst of these symptoms, not as bad as it was but this bothers him the most.  He notes that when blood pressure is high/up and down, things seem to be worse for him.  He has not measured blood pressure during 1 of these episodes but at this point they are few and far between.  He would like to try increasing the blood pressure medication and see if this helps anything  Seen by [facility] neurology, see their notes for full details. 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   Chemo every 6 months for history of bladder cancer   Observations/Objective: Wt 210 lb (95.3 kg)   BMI 30.13 kg/m  BP Readings from Last 3 Encounters:  09/15/18 (!) 147/95  06/26/18 (!) 174/85  04/07/18 137/80   Exam: Normal Speech.  NAD  Lab and Radiology Results No results found for this or any  previous visit (from the past 72 hour(s)). No results found.     Assessment and Plan: 68 y.o. male with The primary encounter diagnosis was Autonomic instability. Diagnoses of Malignant neoplasm of overlapping sites of bladder ([place]), Rosacea, and Hypertension goal BP (blood pressure) < 130/80 were also pertinent to this visit.  I am okay to trial increase in antihypertensive medications, close monitoring, patient was advised to decrease dose again if orthostasis worsens.  Refilled antibiotics for rosacea.   PDMP not reviewed this encounter. No orders of the defined types were placed in this encounter.  Meds ordered this encounter  Medications  . ampicillin (PRINCIPEN) 500 MG capsule    Sig: Take 1 capsule (500 mg total) by mouth daily.    Dispense:  90 capsule    Refill:  3  . irbesartan (AVAPRO) 150 MG tablet    Sig: Take 1 tablet (150 mg total) by mouth daily.    Dispense:  180 tablet    Refill:  3      Follow Up Instructions: Return in about 6 months (around 10/19/2019) for ANNUAL (call week prior to visit for lab orders), or see me sooner if needed.    I discussed the assessment and treatment plan with the patient. The patient was provided an opportunity to ask questions and all were answered. The patient agreed with the plan and demonstrated an understanding of the instructions.   The patient was  advised to call back or seek an in-person evaluation if any new concerns, if symptoms worsen or if the condition fails to improve as anticipated.  25 minutes of non-face-to-face time was provided during this encounter.      . . . . . . . . . . . . . Marland Kitchen                   Historical information moved to improve visibility of documentation.  Past Medical History:  Diagnosis Date  . BPH (benign prostatic hyperplasia) 04/08/2013  . Chronic kidney disease, stage 2, mildly decreased GFR 06/30/2018  . Dysautonomia ([place]) 09/14/2018   Striational  antibody 1:960   . Dysautonomia ([place])   . Episodic lightheadedness   . History of CVA (cerebrovascular accident) 12/25/2015  . HTN (hypertension) 10/12/2013  . Irritable bowel syndrome 11/27/2016  . Mass of right parotid gland 12/03/2018   PET scan [id number] - Hypermetabolic right parotid nodule on image 37 measuring 12 mm with an SUV Max of 6.2. Image 37    . PFO (patent foramen ovale) 12/25/2015  . Rosacea    Past Surgical History:  Procedure Laterality Date  . EYE SURGERY Bilateral 1999   cataract  . TRANSURETHRAL RESECTION OF BLADDER TUMOR WITH GYRUS (TURBT-GYRUS)  12/30/2016  . TRANSURETHRAL RESECTION OF PROSTATE  06/16/2017   Social History   Tobacco Use  . Smoking status: Former Smoker    Packs/day: 1.00    Years: 27.00    Pack years: 27.00    Types: Cigarettes    Quit date: 08/10/2000    Years since quitting: 18.7  . Smokeless tobacco: Never Used  Substance Use Topics  . Alcohol use: Not Currently   family history includes Hypertension in his father.  Medications: Current Outpatient Medications  Medication Sig Dispense Refill  . ampicillin (PRINCIPEN) 500 MG capsule Take 1 capsule (500 mg total) by mouth daily. Must schedule appointment for refills 30 capsule 0  . aspirin EC 81 MG tablet Take 81 mg by mouth daily.    . irbesartan (AVAPRO) 150 MG tablet Take 1 tablet (150 mg total) by mouth daily. Must schedule appointment for refills 30 tablet 0   No current facility-administered medications for this visit.   Allergies  Allergen Reactions  . Oxytetracycline Other (See Comments)    Unknown FOUND WITH ALLERGY SKIN TEST AT AGE OF 6

## 2019-04-29 DIAGNOSIS — C61 Malignant neoplasm of prostate: Secondary | ICD-10-CM | POA: Diagnosis not present

## 2019-04-29 DIAGNOSIS — C679 Malignant neoplasm of bladder, unspecified: Secondary | ICD-10-CM | POA: Diagnosis not present

## 2019-05-12 DIAGNOSIS — Z5111 Encounter for antineoplastic chemotherapy: Secondary | ICD-10-CM | POA: Diagnosis not present

## 2019-05-12 DIAGNOSIS — C679 Malignant neoplasm of bladder, unspecified: Secondary | ICD-10-CM | POA: Diagnosis not present

## 2019-05-13 DIAGNOSIS — C61 Malignant neoplasm of prostate: Secondary | ICD-10-CM | POA: Diagnosis not present

## 2019-05-19 DIAGNOSIS — C679 Malignant neoplasm of bladder, unspecified: Secondary | ICD-10-CM | POA: Diagnosis not present

## 2019-05-19 DIAGNOSIS — Z5111 Encounter for antineoplastic chemotherapy: Secondary | ICD-10-CM | POA: Diagnosis not present

## 2019-05-21 DIAGNOSIS — R300 Dysuria: Secondary | ICD-10-CM | POA: Diagnosis not present

## 2019-05-26 DIAGNOSIS — Z5111 Encounter for antineoplastic chemotherapy: Secondary | ICD-10-CM | POA: Diagnosis not present

## 2019-05-26 DIAGNOSIS — C679 Malignant neoplasm of bladder, unspecified: Secondary | ICD-10-CM | POA: Diagnosis not present

## 2019-05-31 DIAGNOSIS — H538 Other visual disturbances: Secondary | ICD-10-CM | POA: Diagnosis not present

## 2019-05-31 DIAGNOSIS — C61 Malignant neoplasm of prostate: Secondary | ICD-10-CM | POA: Diagnosis not present

## 2019-05-31 DIAGNOSIS — G901 Familial dysautonomia [Riley-Day]: Secondary | ICD-10-CM | POA: Diagnosis not present

## 2019-05-31 DIAGNOSIS — K5909 Other constipation: Secondary | ICD-10-CM | POA: Diagnosis not present

## 2019-05-31 DIAGNOSIS — Z87891 Personal history of nicotine dependence: Secondary | ICD-10-CM | POA: Diagnosis not present

## 2019-06-23 ENCOUNTER — Other Ambulatory Visit: Payer: Self-pay

## 2019-06-23 ENCOUNTER — Ambulatory Visit (INDEPENDENT_AMBULATORY_CARE_PROVIDER_SITE_OTHER): Payer: Medicare Other | Admitting: Family Medicine

## 2019-06-23 ENCOUNTER — Encounter: Payer: Self-pay | Admitting: Family Medicine

## 2019-06-23 DIAGNOSIS — K5909 Other constipation: Secondary | ICD-10-CM

## 2019-06-23 DIAGNOSIS — I10 Essential (primary) hypertension: Secondary | ICD-10-CM | POA: Diagnosis not present

## 2019-06-23 MED ORDER — LINACLOTIDE 145 MCG PO CAPS: 145.0000 ug | ORAL_CAPSULE | Freq: Every day | ORAL | 1 refills | Status: DC

## 2019-06-23 NOTE — Patient Instructions (Signed)
Nice to meet you today.  Lets give the linzess a trial and see if that improves your symptoms.  We'll plan to follow up in about 4-6 weeks.

## 2019-06-27 ENCOUNTER — Encounter: Payer: Self-pay | Admitting: Family Medicine

## 2019-06-27 NOTE — Assessment & Plan Note (Signed)
BP elevated today.  Reports normal readings at home and BP often is elevated in office.  Continue to monitor at home. Bring home cuff into next appt so that we can compare with our equipment to ensure that this is accurate.

## 2019-06-27 NOTE — Assessment & Plan Note (Signed)
Discussed additional options for management of his chronic constipation including linzess.  We discussed limiting the amount of laxatives he is using.  He should also try to drink plenty of fluids and try be sure to get regular physical activity.  Will provide trial of linzess, if not improving with this will need referral to GI.

## 2019-06-27 NOTE — Progress Notes (Signed)
Blake Rice - 69 y.o. male MRN [id number]  Date of birth: 1951-04-17  Subjective Chief Complaint  Patient presents with  . Constipation    HPI Blake Rice is a 69 y.o. male with history of dysautonomia, HTN, chronic constipation, and bladder cancer here today to discuss chronic constipation.  He has been followed by neurology at [facility] for his dysautonomia and reports that he discussed his chronic constipation at his last appt.  States he was started on erythromycin but this has not really helped with his constipation. Notes reviewed from Dr. Manuella Ghazi at [facility] through care everywhere and it seems that the erythromycin was started for gastroparesis and he was instructed to see GI for chronic constipation.  He has tried miralax without much relief.  He does get relief with senna if taking if not having a bm after 3-4 days.  He would like to discuss additional medications that may be helpful for his symptoms.    ROS:  A comprehensive ROS was completed and negative except as noted per HPI  Allergies  Allergen Reactions  . Oxytetracycline Other (See Comments)    Unknown FOUND WITH ALLERGY SKIN TEST AT AGE OF 6    Past Medical History:  Diagnosis Date  . BPH (benign prostatic hyperplasia) 04/08/2013  . Chronic kidney disease, stage 2, mildly decreased GFR 06/30/2018  . Dysautonomia ([place]) 09/14/2018   Striational antibody 1:960   . Dysautonomia ([place])   . Episodic lightheadedness   . History of CVA (cerebrovascular accident) 12/25/2015  . HTN (hypertension) 10/12/2013  . Irritable bowel syndrome 11/27/2016  . Mass of right parotid gland 12/03/2018   PET scan [id number] - Hypermetabolic right parotid nodule on image 37 measuring 12 mm with an SUV Max of 6.2. Image 37    . PFO (patent foramen ovale) 12/25/2015  . Rosacea     Past Surgical History:  Procedure Laterality Date  . EYE SURGERY Bilateral 1999   cataract  . TRANSURETHRAL RESECTION OF BLADDER TUMOR WITH GYRUS (TURBT-GYRUS)  12/30/2016  .  TRANSURETHRAL RESECTION OF PROSTATE  06/16/2017    Social History   Socioeconomic History  . Marital status: Single    Spouse name: Not on file  . Number of children: Not on file  . Years of education: Not on file  . Highest education level: Not on file  Occupational History  . Not on file  Tobacco Use  . Smoking status: Former Smoker    Packs/day: 1.00    Years: 27.00    Pack years: 27.00    Types: Cigarettes    Quit date: 08/10/2000    Years since quitting: 18.8  . Smokeless tobacco: Never Used  Substance and Sexual Activity  . Alcohol use: Not Currently  . Drug use: Never  . Sexual activity: Not Currently  Other Topics Concern  . Not on file  Social History Narrative  . Not on file   Social Determinants of Health   Financial Resource Strain:   . Difficulty of Paying Living Expenses: Not on file  Food Insecurity:   . Worried About Charity fundraiser in the Last Year: Not on file  . Ran Out of Food in the Last Year: Not on file  Transportation Needs:   . Lack of Transportation (Medical): Not on file  . Lack of Transportation (Non-Medical): Not on file  Physical Activity:   . Days of Exercise per Week: Not on file  . Minutes of Exercise per Session: Not on file  Stress:   .  Feeling of Stress : Not on file  Social Connections:   . Frequency of Communication with Friends and Family: Not on file  . Frequency of Social Gatherings with Friends and Family: Not on file  . Attends Religious Services: Not on file  . Active Member of Clubs or Organizations: Not on file  . Attends Archivist Meetings: Not on file  . Marital Status: Not on file    Family History  Problem Relation Age of Onset  . Hypertension Father     Health Maintenance  Topic Date Due  . Hepatitis C Screening  04-Jan-1951  . COLONOSCOPY  01/30/2003  . COLON CANCER SCREENING ANNUAL FOBT  12/19/2017  . TETANUS/TDAP  06/27/2019 (Originally 01/30/1972)  . PNA vac Low Risk Adult (1 of 2 -  PCV13) 04/19/2020 (Originally 01/29/2018)  . INFLUENZA VACCINE  Completed     ----------------------------------------------------------------------------------------------------------------------------------------------------------------------------------------------------------------- Physical Exam BP (!) 167/83   Pulse (!) 103   Ht 5\' 10"  (1.778 m)   Wt 221 lb (100.2 kg)   BMI 31.71 kg/m   Physical Exam Constitutional:      Appearance: Normal appearance.  HENT:     Head: Normocephalic and atraumatic.  Eyes:     General: No scleral icterus. Cardiovascular:     Rate and Rhythm: Normal rate and regular rhythm.     Pulses: Normal pulses.     Heart sounds: Normal heart sounds.  Abdominal:     General: Abdomen is flat. There is no distension.     Tenderness: There is no abdominal tenderness.  Musculoskeletal:     Cervical back: Neck supple.  Skin:    General: Skin is warm and dry.  Neurological:     General: No focal deficit present.     Mental Status: He is alert.  Psychiatric:        Mood and Affect: Mood normal.        Behavior: Behavior normal.     ------------------------------------------------------------------------------------------------------------------------------------------------------------------------------------------------------------------- Assessment and Plan  Chronic constipation Discussed additional options for management of his chronic constipation including linzess.  We discussed limiting the amount of laxatives he is using.  He should also try to drink plenty of fluids and try be sure to get regular physical activity.  Will provide trial of linzess, if not improving with this will need referral to GI.    HTN (hypertension) BP elevated today.  Reports normal readings at home and BP often is elevated in office.  Continue to monitor at home. Bring home cuff into next appt so that we can compare with our equipment to ensure that this is accurate.     Meds ordered this encounter  Medications  . linaclotide (LINZESS) 145 MCG CAPS capsule    Sig: Take 1 capsule (145 mcg total) by mouth daily before breakfast.    Dispense:  30 capsule    Refill:  1    No follow-ups on file.    This visit occurred during the SARS-CoV-2 public health emergency.  Safety protocols were in place, including screening questions prior to the visit, additional usage of staff PPE, and extensive cleaning of exam room while observing appropriate contact time as indicated for disinfecting solutions.

## 2019-07-09 DIAGNOSIS — Z23 Encounter for immunization: Secondary | ICD-10-CM | POA: Diagnosis not present

## 2019-07-22 ENCOUNTER — Encounter: Payer: Self-pay | Admitting: Family Medicine

## 2019-07-23 ENCOUNTER — Other Ambulatory Visit: Payer: Self-pay | Admitting: Family Medicine

## 2019-07-23 MED ORDER — LINACLOTIDE 145 MCG PO CAPS: 145.0000 ug | ORAL_CAPSULE | Freq: Every day | ORAL | 10 refills | Status: DC

## 2019-07-30 DIAGNOSIS — Z23 Encounter for immunization: Secondary | ICD-10-CM | POA: Diagnosis not present

## 2019-10-22 DIAGNOSIS — C61 Malignant neoplasm of prostate: Secondary | ICD-10-CM | POA: Diagnosis not present

## 2019-10-22 DIAGNOSIS — C679 Malignant neoplasm of bladder, unspecified: Secondary | ICD-10-CM | POA: Diagnosis not present

## 2019-11-10 DIAGNOSIS — Z5111 Encounter for antineoplastic chemotherapy: Secondary | ICD-10-CM | POA: Diagnosis not present

## 2019-11-10 DIAGNOSIS — C678 Malignant neoplasm of overlapping sites of bladder: Secondary | ICD-10-CM | POA: Diagnosis not present

## 2019-11-17 DIAGNOSIS — C678 Malignant neoplasm of overlapping sites of bladder: Secondary | ICD-10-CM | POA: Diagnosis not present

## 2019-11-17 DIAGNOSIS — Z5111 Encounter for antineoplastic chemotherapy: Secondary | ICD-10-CM | POA: Diagnosis not present

## 2019-11-24 DIAGNOSIS — Z5111 Encounter for antineoplastic chemotherapy: Secondary | ICD-10-CM | POA: Diagnosis not present

## 2019-11-24 DIAGNOSIS — C678 Malignant neoplasm of overlapping sites of bladder: Secondary | ICD-10-CM | POA: Diagnosis not present

## 2019-11-25 ENCOUNTER — Ambulatory Visit (INDEPENDENT_AMBULATORY_CARE_PROVIDER_SITE_OTHER): Payer: Medicare Other | Admitting: Family Medicine

## 2019-11-25 ENCOUNTER — Encounter: Payer: Self-pay | Admitting: Family Medicine

## 2019-11-25 ENCOUNTER — Other Ambulatory Visit: Payer: Self-pay

## 2019-11-25 DIAGNOSIS — C678 Malignant neoplasm of overlapping sites of bladder: Secondary | ICD-10-CM

## 2019-11-25 DIAGNOSIS — I10 Essential (primary) hypertension: Secondary | ICD-10-CM

## 2019-11-25 MED ORDER — IRBESARTAN-HYDROCHLOROTHIAZIDE 150-12.5 MG PO TABS: 1.0000 | ORAL_TABLET | Freq: Every day | ORAL | 1 refills | Status: DC

## 2019-11-25 NOTE — Patient Instructions (Signed)
Nice to see you today! Start Irbesartan-Hctz 150/12.5mg  each day.  Follow up with me in about 4-6 weeks.

## 2019-11-25 NOTE — Assessment & Plan Note (Signed)
Blood pressure is not at goal at for age and co-morbidities.  I recommend changing irbesartan 150mg  to irbesartan/hctz 150/12.5mg .  In addition they were instructed to follow a low sodium diet with regular exercise to help to maintain adequate control of blood pressure.

## 2019-11-25 NOTE — Progress Notes (Signed)
Blake Rice - 69 y.o. male MRN [id number]  Date of birth: 03-25-1951  Subjective Chief Complaint  Patient presents with  . Blood Pressure Check    HPI Blake Rice is a 69 y.o. male here today for follow up of HTN.  He reports that past few times he has went for his chemotherapy treatment his systolic BP readings have been elevated.  He is taking irbesartan 150mg  daily.  He denies side effects from medication and denies any symptoms related to HTN including chest pain, headache, shortness of breath, palpitations.   His BP does improved if exercising regularly.  He is working on sodium reduction.    ROS:  A comprehensive ROS was completed and negative except as noted per HPI  Allergies  Allergen Reactions  . Oxytetracycline Other (See Comments)    Unknown FOUND WITH ALLERGY SKIN TEST AT AGE OF 6    Past Medical History:  Diagnosis Date  . BPH (benign prostatic hyperplasia) 04/08/2013  . Chronic kidney disease, stage 2, mildly decreased GFR 06/30/2018  . Dysautonomia ([place]) 09/14/2018   Striational antibody 1:960   . Dysautonomia ([place])   . Episodic lightheadedness   . History of CVA (cerebrovascular accident) 12/25/2015  . HTN (hypertension) 10/12/2013  . Irritable bowel syndrome 11/27/2016  . Mass of right parotid gland 12/03/2018   PET scan 4/62/70 - Hypermetabolic right parotid nodule on image 37 measuring 12 mm with an SUV Max of 6.2. Image 37    . PFO (patent foramen ovale) 12/25/2015  . Rosacea     Past Surgical History:  Procedure Laterality Date  . EYE SURGERY Bilateral 1999   cataract  . TRANSURETHRAL RESECTION OF BLADDER TUMOR WITH GYRUS (TURBT-GYRUS)  12/30/2016  . TRANSURETHRAL RESECTION OF PROSTATE  06/16/2017    Social History   Socioeconomic History  . Marital status: Single    Spouse name: Not on file  . Number of children: Not on file  . Years of education: Not on file  . Highest education level: Not on file  Occupational History  . Not on file   Tobacco Use  . Smoking status: Former Smoker    Packs/day: 1.00    Years: 27.00    Pack years: 27.00    Types: Cigarettes    Quit date: 08/10/2000    Years since quitting: 19.3  . Smokeless tobacco: Never Used  Vaping Use  . Vaping Use: Never used  Substance and Sexual Activity  . Alcohol use: Not Currently  . Drug use: Never  . Sexual activity: Not Currently  Other Topics Concern  . Not on file  Social History Narrative  . Not on file   Social Determinants of Health   Financial Resource Strain:   . Difficulty of Paying Living Expenses:   Food Insecurity:   . Worried About Charity fundraiser in the Last Year:   . Arboriculturist in the Last Year:   Transportation Needs:   . Film/video editor (Medical):   Marland Kitchen Lack of Transportation (Non-Medical):   Physical Activity:   . Days of Exercise per Week:   . Minutes of Exercise per Session:   Stress:   . Feeling of Stress :   Social Connections:   . Frequency of Communication with Friends and Family:   . Frequency of Social Gatherings with Friends and Family:   . Attends Religious Services:   . Active Member of Clubs or Organizations:   . Attends Archivist Meetings:   .  Marital Status:     Family History  Problem Relation Age of Onset  . Hypertension Father     Health Maintenance  Topic Date Due  . Hepatitis C Screening  Never done  . TETANUS/TDAP  Never done  . COLONOSCOPY  Never done  . COLON CANCER SCREENING ANNUAL FOBT  12/19/2017  . PNA vac Low Risk Adult (1 of 2 - PCV13) 04/19/2020 (Originally 01/29/2018)  . INFLUENZA VACCINE  12/05/2019  . COVID-19 Vaccine  Completed     ----------------------------------------------------------------------------------------------------------------------------------------------------------------------------------------------------------------- Physical Exam BP (!) 144/73 (BP Location: Left Arm, Patient Position: Sitting, Cuff Size: Large)   Pulse (!) 107    Wt (!) 215 lb 1.9 oz (97.6 kg)   SpO2 96%   BMI 30.87 kg/m   Physical Exam Constitutional:      Appearance: Normal appearance.  HENT:     Head: Normocephalic and atraumatic.  Eyes:     General: No scleral icterus. Cardiovascular:     Rate and Rhythm: Normal rate and regular rhythm.  Pulmonary:     Effort: Pulmonary effort is normal.     Breath sounds: Normal breath sounds.  Musculoskeletal:     Cervical back: Neck supple.  Skin:    General: Skin is warm and dry.  Neurological:     General: No focal deficit present.     Mental Status: He is alert.  Psychiatric:        Mood and Affect: Mood normal.        Behavior: Behavior normal.     --- ---------------------------------------------------------------------------------------------------------------------------------------------------------------------------------------------------------------- Assessment and Plan  HTN (hypertension) Blood pressure is not at goal at for age and co-morbidities.  I recommend changing irbesartan 150mg  to irbesartan/hctz 150/12.5mg .  In addition they were instructed to follow a low sodium diet with regular exercise to help to maintain adequate control of blood pressure.     Malignant neoplasm of overlapping sites of bladder [facility]) Followed by urology, has two additional cycles of chemotherapy.     Meds ordered this encounter  Medications  . irbesartan-hydrochlorothiazide (AVALIDE) 150-12.5 MG tablet    Sig: Take 1 tablet by mouth daily.    Dispense:  90 tablet    Refill:  1    Return in about 5 weeks (around 12/30/2019) for HTN.    This visit occurred during the SARS-CoV-2 public health emergency.  Safety protocols were in place, including screening questions prior to the visit, additional usage of staff PPE, and extensive cleaning of exam room while observing appropriate contact time as indicated for disinfecting solutions.

## 2019-11-25 NOTE — Assessment & Plan Note (Signed)
Followed by urology, has two additional cycles of chemotherapy.

## 2020-01-06 ENCOUNTER — Ambulatory Visit (INDEPENDENT_AMBULATORY_CARE_PROVIDER_SITE_OTHER): Payer: Medicare Other | Admitting: Family Medicine

## 2020-01-06 ENCOUNTER — Encounter: Payer: Self-pay | Admitting: Family Medicine

## 2020-01-06 ENCOUNTER — Other Ambulatory Visit: Payer: Self-pay

## 2020-01-06 VITALS — BP 155/89 | HR 58 | Wt 215.5 lb

## 2020-01-06 DIAGNOSIS — I10 Essential (primary) hypertension: Secondary | ICD-10-CM | POA: Diagnosis not present

## 2020-01-06 DIAGNOSIS — C678 Malignant neoplasm of overlapping sites of bladder: Secondary | ICD-10-CM | POA: Diagnosis not present

## 2020-01-06 DIAGNOSIS — Z1211 Encounter for screening for malignant neoplasm of colon: Secondary | ICD-10-CM | POA: Diagnosis not present

## 2020-01-06 DIAGNOSIS — L719 Rosacea, unspecified: Secondary | ICD-10-CM | POA: Diagnosis not present

## 2020-01-06 DIAGNOSIS — K5909 Other constipation: Secondary | ICD-10-CM

## 2020-01-06 MED ORDER — AMPICILLIN 500 MG PO CAPS: 500.0000 mg | ORAL_CAPSULE | Freq: Four times a day (QID) | ORAL | 3 refills | Status: DC

## 2020-01-06 MED ORDER — LINACLOTIDE 145 MCG PO CAPS: 145.0000 ug | ORAL_CAPSULE | Freq: Every day | ORAL | 3 refills | Status: DC

## 2020-01-06 MED ORDER — IRBESARTAN-HYDROCHLOROTHIAZIDE 150-12.5 MG PO TABS: 1.0000 | ORAL_TABLET | Freq: Every day | ORAL | 3 refills | Status: DC

## 2020-01-06 NOTE — Assessment & Plan Note (Signed)
He continues to do well with linzess, continue at current strength

## 2020-01-06 NOTE — Progress Notes (Signed)
Blake Rice - 69 y.o. male MRN [id number]  Date of birth: [birth year]  Subjective Chief Complaint  Patient presents with  . Hypertension    HPI Blake Rice is a 69 y.o. male here today for follow up visit.  He has a history of HTN, rosacea, and chronic constipation.  Constipation has done well since starting linzess, no side effects from this.   He is taking irbesartan/hctz for management of HTN.  He has been checking BP at home each day.  Average of readings over the past 2 months is 134/75.  BP elevated in clinic today.  He denies symptoms related to HTN including chest pain, shortness of breath, palpitations, or headache.    ROS:  A comprehensive ROS was completed and negative except as noted per HPI  Allergies  Allergen Reactions  . Oxytetracycline Other (See Comments)    Unknown FOUND WITH ALLERGY SKIN TEST AT AGE OF 6    Past Medical History:  Diagnosis Date  . BPH (benign prostatic hyperplasia) 04/08/2013  . Chronic kidney disease, stage 2, mildly decreased GFR 06/30/2018  . Dysautonomia ([place]) 09/14/2018   Striational antibody 1:960   . Dysautonomia ([place])   . Episodic lightheadedness   . History of CVA (cerebrovascular accident) 12/25/2015  . HTN (hypertension) 10/12/2013  . Irritable bowel syndrome 11/27/2016  . Mass of right parotid gland 12/03/2018   PET scan 5/46/27 - Hypermetabolic right parotid nodule on image 37 measuring 12 mm with an SUV Max of 6.2. Image 37    . PFO (patent foramen ovale) 12/25/2015  . Rosacea     Past Surgical History:  Procedure Laterality Date  . EYE SURGERY Bilateral 1999   cataract  . TRANSURETHRAL RESECTION OF BLADDER TUMOR WITH GYRUS (TURBT-GYRUS)  12/30/2016  . TRANSURETHRAL RESECTION OF PROSTATE  06/16/2017    Social History   Socioeconomic History  . Marital status: Single    Spouse name: Not on file  . Number of children: Not on file  . Years of education: Not on file  . Highest education level: Not on file  Occupational  History  . Not on file  Tobacco Use  . Smoking status: Former Smoker    Packs/day: 1.00    Years: 27.00    Pack years: 27.00    Types: Cigarettes    Quit date: 08/10/2000    Years since quitting: 19.4  . Smokeless tobacco: Never Used  Vaping Use  . Vaping Use: Never used  Substance and Sexual Activity  . Alcohol use: Not Currently  . Drug use: Never  . Sexual activity: Not Currently  Other Topics Concern  . Not on file  Social History Narrative  . Not on file   Social Determinants of Health   Financial Resource Strain:   . Difficulty of Paying Living Expenses: Not on file  Food Insecurity:   . Worried About Charity fundraiser in the Last Year: Not on file  . Ran Out of Food in the Last Year: Not on file  Transportation Needs:   . Lack of Transportation (Medical): Not on file  . Lack of Transportation (Non-Medical): Not on file  Physical Activity:   . Days of Exercise per Week: Not on file  . Minutes of Exercise per Session: Not on file  Stress:   . Feeling of Stress : Not on file  Social Connections:   . Frequency of Communication with Friends and Family: Not on file  . Frequency of Social Gatherings with Friends and  Family: Not on file  . Attends Religious Services: Not on file  . Active Member of Clubs or Organizations: Not on file  . Attends Archivist Meetings: Not on file  . Marital Status: Not on file    Family History  Problem Relation Age of Onset  . Hypertension Father     Health Maintenance  Topic Date Due  . Hepatitis C Screening  Never done  . TETANUS/TDAP  Never done  . COLONOSCOPY  Never done  . COLON CANCER SCREENING ANNUAL FOBT  12/19/2017  . INFLUENZA VACCINE  12/05/2019  . PNA vac Low Risk Adult (1 of 2 - PCV13) 04/19/2020 (Originally 01/29/2018)  . COVID-19 Vaccine  Completed      ----------------------------------------------------------------------------------------------------------------------------------------------------------------------------------------------------------------- Physical Exam BP (!) 155/89 (BP Location: Left Arm, Patient Position: Sitting, Cuff Size: Normal)   Pulse (!) 58   Wt 215 lb 8 oz (97.8 kg)   SpO2 97%   BMI 30.92 kg/m   Physical Exam Constitutional:      Appearance: Normal appearance.  Eyes:     General: No scleral icterus. Cardiovascular:     Rate and Rhythm: Normal rate and regular rhythm.  Pulmonary:     Effort: Pulmonary effort is normal.     Breath sounds: Normal breath sounds.  Musculoskeletal:     Cervical back: Neck supple.  Skin:    General: Skin is warm and dry.  Neurological:     General: No focal deficit present.     Mental Status: He is alert.     ------------------------------------------------------------------------------------------------------------------------------------------------------------------------------------------------------------------- Assessment and Plan  HTN (hypertension) Elevated BP in clinic however readings at home have been well controlled.  He will continue current medication and monitoring at home.  I will plan to see him again in 6 months.    Malignant neoplasm of overlapping sites of bladder [facility]) He continues to follow with urology for this.  Stable at this time.   Chronic constipation He continues to do well with linzess, continue at current strength   Meds ordered this encounter  Medications  . irbesartan-hydrochlorothiazide (AVALIDE) 150-12.5 MG tablet    Sig: Take 1 tablet by mouth daily.    Dispense:  90 tablet    Refill:  3  . linaclotide (LINZESS) 145 MCG CAPS capsule    Sig: Take 1 capsule (145 mcg total) by mouth daily before breakfast.    Dispense:  90 capsule    Refill:  3  . ampicillin (PRINCIPEN) 500 MG capsule    Sig: Take 1 capsule (500  mg total) by mouth 4 (four) times daily. As needed for rosacea.    Dispense:  120 capsule    Refill:  3    Return in about 6 months (around 07/05/2020) for HTN.    This visit occurred during the SARS-CoV-2 public health emergency.  Safety protocols were in place, including screening questions prior to the visit, additional usage of staff PPE, and extensive cleaning of exam room while observing appropriate contact time as indicated for disinfecting solutions.

## 2020-01-06 NOTE — Assessment & Plan Note (Signed)
Elevated BP in clinic however readings at home have been well controlled.  He will continue current medication and monitoring at home.  I will plan to see him again in 6 months.

## 2020-01-06 NOTE — Patient Instructions (Signed)
Continue current medications.  See me again in 6 months.  We need to update labs at your next visit.

## 2020-01-06 NOTE — Assessment & Plan Note (Signed)
He continues to follow with urology for this.  Stable at this time.

## 2020-03-15 DIAGNOSIS — H5213 Myopia, bilateral: Secondary | ICD-10-CM | POA: Diagnosis not present

## 2020-03-15 DIAGNOSIS — H524 Presbyopia: Secondary | ICD-10-CM | POA: Diagnosis not present

## 2020-03-15 DIAGNOSIS — Z961 Presence of intraocular lens: Secondary | ICD-10-CM | POA: Diagnosis not present

## 2020-03-15 DIAGNOSIS — H43813 Vitreous degeneration, bilateral: Secondary | ICD-10-CM | POA: Diagnosis not present

## 2020-03-15 DIAGNOSIS — H53002 Unspecified amblyopia, left eye: Secondary | ICD-10-CM | POA: Diagnosis not present

## 2020-03-16 DIAGNOSIS — Z23 Encounter for immunization: Secondary | ICD-10-CM | POA: Diagnosis not present

## 2020-04-19 ENCOUNTER — Other Ambulatory Visit: Payer: Self-pay | Admitting: Osteopathic Medicine

## 2020-04-25 DIAGNOSIS — C679 Malignant neoplasm of bladder, unspecified: Secondary | ICD-10-CM | POA: Diagnosis not present

## 2020-04-25 DIAGNOSIS — C61 Malignant neoplasm of prostate: Secondary | ICD-10-CM | POA: Diagnosis not present

## 2020-05-01 ENCOUNTER — Encounter: Payer: Self-pay | Admitting: Family Medicine

## 2020-05-01 MED ORDER — AMPICILLIN 500 MG PO CAPS: 500.0000 mg | ORAL_CAPSULE | Freq: Four times a day (QID) | ORAL | 3 refills | Status: DC

## 2020-05-08 DIAGNOSIS — C61 Malignant neoplasm of prostate: Secondary | ICD-10-CM | POA: Diagnosis not present

## 2020-05-08 DIAGNOSIS — N400 Enlarged prostate without lower urinary tract symptoms: Secondary | ICD-10-CM | POA: Diagnosis not present

## 2020-07-05 ENCOUNTER — Other Ambulatory Visit: Payer: Self-pay

## 2020-07-05 ENCOUNTER — Encounter: Payer: Self-pay | Admitting: Family Medicine

## 2020-07-05 ENCOUNTER — Ambulatory Visit (INDEPENDENT_AMBULATORY_CARE_PROVIDER_SITE_OTHER): Payer: Medicare Other | Admitting: Family Medicine

## 2020-07-05 VITALS — BP 151/68 | HR 106 | Temp 98.4°F | Wt 223.4 lb

## 2020-07-05 DIAGNOSIS — I10 Essential (primary) hypertension: Secondary | ICD-10-CM

## 2020-07-05 DIAGNOSIS — L719 Rosacea, unspecified: Secondary | ICD-10-CM | POA: Diagnosis not present

## 2020-07-05 DIAGNOSIS — N182 Chronic kidney disease, stage 2 (mild): Secondary | ICD-10-CM | POA: Diagnosis not present

## 2020-07-05 DIAGNOSIS — K5909 Other constipation: Secondary | ICD-10-CM

## 2020-07-05 DIAGNOSIS — Z8673 Personal history of transient ischemic attack (TIA), and cerebral infarction without residual deficits: Secondary | ICD-10-CM

## 2020-07-05 DIAGNOSIS — Z1211 Encounter for screening for malignant neoplasm of colon: Secondary | ICD-10-CM

## 2020-07-05 DIAGNOSIS — Z8546 Personal history of malignant neoplasm of prostate: Secondary | ICD-10-CM | POA: Diagnosis not present

## 2020-07-05 DIAGNOSIS — Z13220 Encounter for screening for lipoid disorders: Secondary | ICD-10-CM | POA: Diagnosis not present

## 2020-07-05 MED ORDER — IRBESARTAN-HYDROCHLOROTHIAZIDE 150-12.5 MG PO TABS: 1.0000 | ORAL_TABLET | Freq: Every day | ORAL | 3 refills | Status: DC

## 2020-07-05 MED ORDER — LINACLOTIDE 145 MCG PO CAPS: 145.0000 ug | ORAL_CAPSULE | Freq: Every day | ORAL | 3 refills | Status: DC

## 2020-07-05 MED ORDER — AMPICILLIN 500 MG PO CAPS: 500.0000 mg | ORAL_CAPSULE | Freq: Four times a day (QID) | ORAL | 3 refills | Status: DC

## 2020-07-05 NOTE — Assessment & Plan Note (Signed)
No significant deficits at this time.   Continues on 81mg  asa

## 2020-07-05 NOTE — Patient Instructions (Signed)
Great to see you! Have labs completed.  See me again in 6 months.

## 2020-07-05 NOTE — Assessment & Plan Note (Signed)
Continue ampicillin at current strength

## 2020-07-05 NOTE — Assessment & Plan Note (Signed)
Cologuard ordered

## 2020-07-05 NOTE — Assessment & Plan Note (Signed)
Linzess continues to work very well for him.  Continue at current strength.

## 2020-07-05 NOTE — Assessment & Plan Note (Signed)
Continue irbesartan/hctz at current dose with home monitoring as well.  Encouraged low sodium diet.

## 2020-07-05 NOTE — Assessment & Plan Note (Signed)
Possible recurrence.  Has upcoming biopsy with urology.

## 2020-07-05 NOTE — Progress Notes (Signed)
Blake Rice - 70 y.o. male MRN [id number]  Date of birth: 1950-09-18  Subjective Chief Complaint  Patient presents with  . Hypertension    HPI Blake Rice is a 70 y.o. male here today for follow up of HTN, rosacea and chronic constipation.  He continue to see urology as well for history of bladder cancer.  Recent MRI showed possible malignancy adjacent to prostate.  He has upcoming biopsy for this.  HTN is managed with irbesartan/hctz.  BP is elevated today but attempts to increase medication previously have worsened his orthostasis.  Readings at home have been pretty well controlled. He denies chest pain, shortness of breath, palpitations, headache.   Chronic constipation remains well controlled with linzess.  No diarrhea or other side effects related to this.    Rosacea remains well controlled with ampicillin.     He is due to have updated cologuard.   ROS:  A comprehensive ROS was completed and negative except as noted per HPI  Allergies  Allergen Reactions  . Oxytetracycline Other (See Comments)    Unknown FOUND WITH ALLERGY SKIN TEST AT AGE OF 6    Past Medical History:  Diagnosis Date  . BPH (benign prostatic hyperplasia) 04/08/2013  . Chronic kidney disease, stage 2, mildly decreased GFR 06/30/2018  . Dysautonomia ([place]) 09/14/2018   Striational antibody 1:960   . Dysautonomia ([place])   . Episodic lightheadedness   . History of CVA (cerebrovascular accident) 12/25/2015  . HTN (hypertension) 10/12/2013  . Irritable bowel syndrome 11/27/2016  . Mass of right parotid gland 12/03/2018   PET scan 3/41/93 - Hypermetabolic right parotid nodule on image 37 measuring 12 mm with an SUV Max of 6.2. Image 37    . PFO (patent foramen ovale) 12/25/2015  . Rosacea     Past Surgical History:  Procedure Laterality Date  . EYE SURGERY Bilateral 1999   cataract  . TRANSURETHRAL RESECTION OF BLADDER TUMOR WITH GYRUS (TURBT-GYRUS)  12/30/2016  . TRANSURETHRAL RESECTION OF PROSTATE   06/16/2017    Social History   Socioeconomic History  . Marital status: Single    Spouse name: Not on file  . Number of children: Not on file  . Years of education: Not on file  . Highest education level: Not on file  Occupational History  . Not on file  Tobacco Use  . Smoking status: Former Smoker    Packs/day: 1.00    Years: 27.00    Pack years: 27.00    Types: Cigarettes    Quit date: 08/10/2000    Years since quitting: 19.9  . Smokeless tobacco: Never Used  Vaping Use  . Vaping Use: Never used  Substance and Sexual Activity  . Alcohol use: Not Currently  . Drug use: Never  . Sexual activity: Not Currently  Other Topics Concern  . Not on file  Social History Narrative  . Not on file   Social Determinants of Health   Financial Resource Strain: Not on file  Food Insecurity: Not on file  Transportation Needs: Not on file  Physical Activity: Not on file  Stress: Not on file  Social Connections: Not on file    Family History  Problem Relation Age of Onset  . Hypertension Father     Health Maintenance  Topic Date Due  . Hepatitis C Screening  Never done  . TETANUS/TDAP  Never done  . COLONOSCOPY (Pts 45-81yrs Insurance coverage will need to be confirmed)  Never done  . [place]  FOBT  12/19/2017  . PNA vac Low Risk Adult (1 of 2 - PCV13) Never done  . COVID-19 Vaccine (3 - Pfizer risk 4-dose series) 08/27/2019  . INFLUENZA VACCINE  12/05/2019  . HPV VACCINES  Aged Out     ----------------------------------------------------------------------------------------------------------------------------------------------------------------------------------------------------------------- Physical Exam BP (!) 151/68 (BP Location: Left Arm, Patient Position: Sitting, Cuff Size: Large)   Pulse (!) 106   Temp 98.4 F (36.9 C)   Wt 223 lb 6.4 oz (101.3 kg)   SpO2 98%   BMI 32.05 kg/m   Physical Exam HENT:     Head: Normocephalic and  atraumatic.  Eyes:     General: No scleral icterus. Cardiovascular:     Rate and Rhythm: Normal rate and regular rhythm.  Pulmonary:     Effort: Pulmonary effort is normal.     Breath sounds: Normal breath sounds.  Musculoskeletal:     Cervical back: Neck supple.  Neurological:     General: No focal deficit present.     Mental Status: He is alert.  Psychiatric:        Mood and Affect: Mood normal.        Behavior: Behavior normal.     ------------------------------------------------------------------------------------------------------------------------------------------------------------------------------------------------------------------- Assessment and Plan  HTN (hypertension) Continue irbesartan/hctz at current dose with home monitoring as well.  Encouraged low sodium diet.    Chronic constipation Linzess continues to work very well for him.  Continue at current strength.    Rosacea Continue ampicillin at current strength  Chronic kidney disease, stage 2, mildly decreased GFR Update renal function.   History of CVA (cerebrovascular accident) No significant deficits at this time.   Continues on 81mg  asa  Colon cancer screening Cologuard ordered.   History of prostate cancer Possible recurrence.  Has upcoming biopsy with urology.    Meds ordered this encounter  Medications  . ampicillin (PRINCIPEN) 500 MG capsule    Sig: Take 1 capsule (500 mg total) by mouth 4 (four) times daily. As needed for rosacea.    Dispense:  120 capsule    Refill:  3  . irbesartan-hydrochlorothiazide (AVALIDE) 150-12.5 MG tablet    Sig: Take 1 tablet by mouth daily.    Dispense:  90 tablet    Refill:  3  . linaclotide (LINZESS) 145 MCG CAPS capsule    Sig: Take 1 capsule (145 mcg total) by mouth daily before breakfast.    Dispense:  90 capsule    Refill:  3    No follow-ups on file.    This visit occurred during the SARS-CoV-2 public health emergency.  Safety protocols  were in place, including screening questions prior to the visit, additional usage of staff PPE, and extensive cleaning of exam room while observing appropriate contact time as indicated for disinfecting solutions.

## 2020-07-05 NOTE — Assessment & Plan Note (Signed)
Update renal function.  

## 2020-07-13 DIAGNOSIS — C61 Malignant neoplasm of prostate: Secondary | ICD-10-CM | POA: Diagnosis not present

## 2020-07-18 DIAGNOSIS — N182 Chronic kidney disease, stage 2 (mild): Secondary | ICD-10-CM | POA: Diagnosis not present

## 2020-07-18 DIAGNOSIS — I10 Essential (primary) hypertension: Secondary | ICD-10-CM | POA: Diagnosis not present

## 2020-07-18 DIAGNOSIS — Z13220 Encounter for screening for lipoid disorders: Secondary | ICD-10-CM | POA: Diagnosis not present

## 2020-07-18 LAB — COMPLETE METABOLIC PANEL WITH GFR
AG Ratio: 1.5 (calc) (ref 1.0–2.5)
ALT: 53 U/L — ABNORMAL HIGH (ref 9–46)
AST: 29 U/L (ref 10–35)
Albumin: 4.1 g/dL (ref 3.6–5.1)
Alkaline phosphatase (APISO): 72 U/L (ref 35–144)
BUN/Creatinine Ratio: 17 (calc) (ref 6–22)
BUN: 24 mg/dL (ref 7–25)
CO2: 27 mmol/L (ref 20–32)
Calcium: 9.3 mg/dL (ref 8.6–10.3)
Chloride: 104 mmol/L (ref 98–110)
Creat: 1.38 mg/dL — ABNORMAL HIGH (ref 0.70–1.25)
GFR, Est African American: 61 mL/min/{1.73_m2} (ref >=60)
GFR, Est Non African American: 53 mL/min/{1.73_m2} — ABNORMAL LOW (ref >=60)
Globulin: 2.7 g/dL (calc) (ref 1.9–3.7)
Glucose, Bld: 90 mg/dL (ref 65–99)
Potassium: 4.3 mmol/L (ref 3.5–5.3)
Sodium: 139 mmol/L (ref 135–146)
Total Bilirubin: 0.8 mg/dL (ref 0.2–1.2)
Total Protein: 6.8 g/dL (ref 6.1–8.1)

## 2020-07-18 LAB — CBC
HCT: 45.2 % (ref 38.5–50.0)
Hemoglobin: 15.7 g/dL (ref 13.2–17.1)
MCH: 32.2 pg (ref 27.0–33.0)
MCHC: 34.7 g/dL (ref 32.0–36.0)
MCV: 92.6 fL (ref 80.0–100.0)
MPV: 9.8 fL (ref 7.5–12.5)
Platelets: 232 10*3/uL (ref 140–400)
RBC: 4.88 10*6/uL (ref 4.20–5.80)
RDW: 12.5 % (ref 11.0–15.0)
WBC: 7.1 10*3/uL (ref 3.8–10.8)

## 2020-07-18 LAB — LIPID PANEL W/REFLEX DIRECT LDL
Cholesterol: 156 mg/dL (ref <200)
HDL: 52 mg/dL (ref >=40)
LDL Cholesterol (Calc): 84 mg/dL (calc)
Non-HDL Cholesterol (Calc): 104 mg/dL (calc) (ref <130)
Total CHOL/HDL Ratio: 3 (calc) (ref <5.0)
Triglycerides: 105 mg/dL (ref <150)

## 2020-07-19 ENCOUNTER — Other Ambulatory Visit: Payer: Self-pay

## 2020-07-19 DIAGNOSIS — N182 Chronic kidney disease, stage 2 (mild): Secondary | ICD-10-CM

## 2020-08-07 ENCOUNTER — Other Ambulatory Visit: Payer: Self-pay

## 2020-08-07 DIAGNOSIS — N182 Chronic kidney disease, stage 2 (mild): Secondary | ICD-10-CM | POA: Diagnosis not present

## 2020-08-08 LAB — BASIC METABOLIC PANEL
BUN/Creatinine Ratio: 14 (calc) (ref 6–22)
BUN: 21 mg/dL (ref 7–25)
CO2: 26 mmol/L (ref 20–32)
Calcium: 9.5 mg/dL (ref 8.6–10.3)
Chloride: 99 mmol/L (ref 98–110)
Creat: 1.53 mg/dL — ABNORMAL HIGH (ref 0.70–1.25)
Glucose, Bld: 104 mg/dL — ABNORMAL HIGH (ref 65–99)
Potassium: 4 mmol/L (ref 3.5–5.3)
Sodium: 137 mmol/L (ref 135–146)

## 2020-08-14 ENCOUNTER — Encounter: Payer: Self-pay | Admitting: Family Medicine

## 2020-08-14 DIAGNOSIS — N182 Chronic kidney disease, stage 2 (mild): Secondary | ICD-10-CM

## 2020-08-21 LAB — BASIC METABOLIC PANEL WITH GFR
BUN/Creatinine Ratio: 12 (calc) (ref 6–22)
BUN: 16 mg/dL (ref 7–25)
CO2: 25 mmol/L (ref 20–32)
Calcium: 9.5 mg/dL (ref 8.6–10.3)
Chloride: 106 mmol/L (ref 98–110)
Creat: 1.39 mg/dL — ABNORMAL HIGH (ref 0.70–1.25)
GFR, Est African American: 60 mL/min/{1.73_m2} (ref >=60)
GFR, Est Non African American: 52 mL/min/{1.73_m2} — ABNORMAL LOW (ref >=60)
Glucose, Bld: 104 mg/dL — ABNORMAL HIGH (ref 65–99)
Potassium: 4.5 mmol/L (ref 3.5–5.3)
Sodium: 140 mmol/L (ref 135–146)

## 2020-08-22 ENCOUNTER — Encounter: Payer: Self-pay | Admitting: Family Medicine

## 2020-08-22 ENCOUNTER — Ambulatory Visit (INDEPENDENT_AMBULATORY_CARE_PROVIDER_SITE_OTHER): Payer: Medicare Other | Admitting: Family Medicine

## 2020-08-22 ENCOUNTER — Other Ambulatory Visit: Payer: Self-pay

## 2020-08-22 VITALS — BP 159/80 | HR 97 | Temp 98.2°F | Ht 70.0 in | Wt 218.1 lb

## 2020-08-22 DIAGNOSIS — N182 Chronic kidney disease, stage 2 (mild): Secondary | ICD-10-CM | POA: Diagnosis not present

## 2020-08-22 DIAGNOSIS — I10 Essential (primary) hypertension: Secondary | ICD-10-CM

## 2020-08-22 MED ORDER — AMLODIPINE BESYLATE 5 MG PO TABS: 5.0000 mg | ORAL_TABLET | Freq: Every day | ORAL | 1 refills | Status: DC

## 2020-08-22 NOTE — Patient Instructions (Signed)
Start amlodipine 5mg  daily for blood pressure Have labs completed in 2-3 weeks.  Follow up in 6 weeks for BP check with nurse.

## 2020-08-22 NOTE — Assessment & Plan Note (Addendum)
Recent elevation in creatinine.  Possibly related to bladder cancer treatment, however denies obstructive symptoms.  Improved with holding irbesartan.  Recheck renal function in 2-3 weeks.

## 2020-08-22 NOTE — Assessment & Plan Note (Signed)
Will replace irbesartan/hctz with amlodipine 5mg  daily.  Nurse visit follow up in 4-6 weeks for BP recheck.  May need to continue titration of amlodipine if BP remains elevated.

## 2020-08-22 NOTE — Progress Notes (Signed)
Blake Rice - 70 y.o. male MRN [id number]  Date of birth: February 25, 1951  Subjective Chief Complaint  Patient presents with  . Results  . Hypertension    HPI Blake Rice is a 70 y.o. male here today for follow up of HTN. He has history of bladder and prostate cancer.  He recently completed treatment for bladder cancer.  Recent labs with elevated creatinine.  Rechecked a week later and creatinine had increased more.  I had him hold his irbesartan/hctz and we rechecked renal function after 1 week.  This seems to be improving as creatinine is improved some.  BP is elevated today off antihypertensives.  He denies symptoms including chest pain, shortness of breath, palpitations, headache or vision changes.    ROS:  A comprehensive ROS was completed and negative except as noted per HPI  Allergies  Allergen Reactions  . Oxytetracycline Other (See Comments)    Unknown FOUND WITH ALLERGY SKIN TEST AT AGE OF 6    Past Medical History:  Diagnosis Date  . BPH (benign prostatic hyperplasia) 04/08/2013  . Chronic kidney disease, stage 2, mildly decreased GFR 06/30/2018  . Dysautonomia ([place]) 09/14/2018   Striational antibody 1:960   . Dysautonomia ([place])   . Episodic lightheadedness   . History of CVA (cerebrovascular accident) 12/25/2015  . HTN (hypertension) 10/12/2013  . Irritable bowel syndrome 11/27/2016  . Mass of right parotid gland 12/03/2018   PET scan 12/14/15 - Hypermetabolic right parotid nodule on image 37 measuring 12 mm with an SUV Max of 6.2. Image 37    . PFO (patent foramen ovale) 12/25/2015  . Rosacea     Past Surgical History:  Procedure Laterality Date  . EYE SURGERY Bilateral 1999   cataract  . TRANSURETHRAL RESECTION OF BLADDER TUMOR WITH GYRUS (TURBT-GYRUS)  12/30/2016  . TRANSURETHRAL RESECTION OF PROSTATE  06/16/2017    Social History   Socioeconomic History  . Marital status: Single    Spouse name: Not on file  . Number of children: Not on file  . Years of  education: Not on file  . Highest education level: Not on file  Occupational History  . Not on file  Tobacco Use  . Smoking status: Former Smoker    Packs/day: 1.00    Years: 27.00    Pack years: 27.00    Types: Cigarettes    Quit date: 08/10/2000    Years since quitting: 20.0  . Smokeless tobacco: Never Used  Vaping Use  . Vaping Use: Never used  Substance and Sexual Activity  . Alcohol use: Not Currently  . Drug use: Never  . Sexual activity: Not Currently  Other Topics Concern  . Not on file  Social History Narrative  . Not on file   Social Determinants of Health   Financial Resource Strain: Not on file  Food Insecurity: Not on file  Transportation Needs: Not on file  Physical Activity: Not on file  Stress: Not on file  Social Connections: Not on file    Family History  Problem Relation Age of Onset  . Hypertension Father     Health Maintenance  Topic Date Due  . Hepatitis C Screening  Never done  . TETANUS/TDAP  Never done  . COLONOSCOPY (Pts 45-48yrs Insurance coverage will need to be confirmed)  Never done  . COLON CANCER SCREENING ANNUAL FOBT  12/19/2017  . PNA vac Low Risk Adult (1 of 2 - PCV13) Never done  . COVID-19 Vaccine (3 - Pfizer risk 4-dose series)  08/27/2019  . INFLUENZA VACCINE  12/04/2020  . HPV VACCINES  Aged Out     ----------------------------------------------------------------------------------------------------------------------------------------------------------------------------------------------------------------- Physical Exam BP (!) 159/80 (BP Location: Left Arm, Patient Position: Sitting, Cuff Size: Normal)   Pulse 97   Temp 98.2 F (36.8 C)   Ht 5\' 10"  (1.778 m)   Wt 218 lb 1.6 oz (98.9 kg)   SpO2 99%   BMI 31.29 kg/m   Physical Exam Constitutional:      Appearance: Normal appearance.  HENT:     Head: Normocephalic and atraumatic.  Eyes:     General: No scleral icterus. Cardiovascular:     Rate and Rhythm: Normal  rate and regular rhythm.  Pulmonary:     Effort: Pulmonary effort is normal.     Breath sounds: Normal breath sounds.  Neurological:     General: No focal deficit present.     Mental Status: He is alert.  Psychiatric:        Mood and Affect: Mood normal.     ------------------------------------------------------------------------------------------------------------------------------------------------------------------------------------------------------------------- Assessment and Plan  Chronic kidney disease, stage 2, mildly decreased GFR Recent elevation in creatinine.  Possibly related to bladder cancer treatment, however denies obstructive symptoms.  Improved with holding irbesartan.  Recheck renal function in 2-3 weeks.   HTN (hypertension) Will replace irbesartan/hctz with amlodipine 5mg  daily.  Nurse visit follow up in 4-6 weeks for BP recheck.  May need to continue titration of amlodipine if BP remains elevated.     Meds ordered this encounter  Medications  . amLODipine (NORVASC) 5 MG tablet    Sig: Take 1 tablet (5 mg total) by mouth daily.    Dispense:  30 tablet    Refill:  1    Return in about 6 weeks (around 10/03/2020) for Nurse visit BP check.    This visit occurred during the SARS-CoV-2 public health emergency.  Safety protocols were in place, including screening questions prior to the visit, additional usage of staff PPE, and extensive cleaning of exam room while observing appropriate contact time as indicated for disinfecting solutions.

## 2020-09-08 DIAGNOSIS — N182 Chronic kidney disease, stage 2 (mild): Secondary | ICD-10-CM | POA: Diagnosis not present

## 2020-09-08 LAB — BASIC METABOLIC PANEL
BUN/Creatinine Ratio: 13 (calc) (ref 6–22)
BUN: 17 mg/dL (ref 7–25)
CO2: 26 mmol/L (ref 20–32)
Calcium: 9.6 mg/dL (ref 8.6–10.3)
Chloride: 106 mmol/L (ref 98–110)
Creat: 1.3 mg/dL — ABNORMAL HIGH (ref 0.70–1.25)
Glucose, Bld: 89 mg/dL (ref 65–99)
Potassium: 4.3 mmol/L (ref 3.5–5.3)
Sodium: 140 mmol/L (ref 135–146)

## 2020-09-11 DIAGNOSIS — I824Z2 Acute embolism and thrombosis of unspecified deep veins of left distal lower extremity: Secondary | ICD-10-CM | POA: Insufficient documentation

## 2020-09-11 DIAGNOSIS — R42 Dizziness and giddiness: Secondary | ICD-10-CM | POA: Diagnosis not present

## 2020-09-11 DIAGNOSIS — I2693 Single subsegmental pulmonary embolism without acute cor pulmonale: Secondary | ICD-10-CM | POA: Diagnosis not present

## 2020-09-11 DIAGNOSIS — I2699 Other pulmonary embolism without acute cor pulmonale: Secondary | ICD-10-CM | POA: Diagnosis not present

## 2020-09-11 DIAGNOSIS — Z87891 Personal history of nicotine dependence: Secondary | ICD-10-CM | POA: Diagnosis not present

## 2020-09-11 DIAGNOSIS — R531 Weakness: Secondary | ICD-10-CM | POA: Diagnosis not present

## 2020-09-11 DIAGNOSIS — I10 Essential (primary) hypertension: Secondary | ICD-10-CM | POA: Diagnosis not present

## 2020-09-11 DIAGNOSIS — Z79899 Other long term (current) drug therapy: Secondary | ICD-10-CM | POA: Diagnosis not present

## 2020-09-11 DIAGNOSIS — Z8673 Personal history of transient ischemic attack (TIA), and cerebral infarction without residual deficits: Secondary | ICD-10-CM | POA: Diagnosis not present

## 2020-09-11 DIAGNOSIS — R55 Syncope and collapse: Secondary | ICD-10-CM | POA: Diagnosis not present

## 2020-09-11 DIAGNOSIS — I82442 Acute embolism and thrombosis of left tibial vein: Secondary | ICD-10-CM | POA: Diagnosis not present

## 2020-09-11 DIAGNOSIS — R7989 Other specified abnormal findings of blood chemistry: Secondary | ICD-10-CM | POA: Diagnosis not present

## 2020-09-11 DIAGNOSIS — K449 Diaphragmatic hernia without obstruction or gangrene: Secondary | ICD-10-CM | POA: Diagnosis not present

## 2020-09-11 DIAGNOSIS — Q211 Atrial septal defect: Secondary | ICD-10-CM | POA: Diagnosis not present

## 2020-09-11 DIAGNOSIS — Z8551 Personal history of malignant neoplasm of bladder: Secondary | ICD-10-CM | POA: Diagnosis not present

## 2020-09-11 DIAGNOSIS — Z8546 Personal history of malignant neoplasm of prostate: Secondary | ICD-10-CM | POA: Diagnosis not present

## 2020-09-11 DIAGNOSIS — Z881 Allergy status to other antibiotic agents status: Secondary | ICD-10-CM | POA: Diagnosis not present

## 2020-09-11 DIAGNOSIS — I491 Atrial premature depolarization: Secondary | ICD-10-CM | POA: Diagnosis not present

## 2020-09-12 DIAGNOSIS — I350 Nonrheumatic aortic (valve) stenosis: Secondary | ICD-10-CM | POA: Diagnosis not present

## 2020-09-12 DIAGNOSIS — I2699 Other pulmonary embolism without acute cor pulmonale: Secondary | ICD-10-CM | POA: Diagnosis not present

## 2020-09-14 ENCOUNTER — Encounter: Payer: Self-pay | Admitting: Family Medicine

## 2020-09-14 DIAGNOSIS — Z23 Encounter for immunization: Secondary | ICD-10-CM | POA: Diagnosis not present

## 2020-09-21 ENCOUNTER — Ambulatory Visit (INDEPENDENT_AMBULATORY_CARE_PROVIDER_SITE_OTHER): Payer: Medicare Other | Admitting: Family Medicine

## 2020-09-21 ENCOUNTER — Encounter: Payer: Self-pay | Admitting: Family Medicine

## 2020-09-21 ENCOUNTER — Other Ambulatory Visit: Payer: Self-pay

## 2020-09-21 DIAGNOSIS — I951 Orthostatic hypotension: Secondary | ICD-10-CM | POA: Diagnosis not present

## 2020-09-21 DIAGNOSIS — I824Z2 Acute embolism and thrombosis of unspecified deep veins of left distal lower extremity: Secondary | ICD-10-CM

## 2020-09-21 MED ORDER — APIXABAN 5 MG PO TABS: 5.0000 mg | ORAL_TABLET | Freq: Two times a day (BID) | ORAL | 3 refills | Status: DC

## 2020-09-21 NOTE — Patient Instructions (Signed)
Try holding amlodipine for a couple of days. Check bp at home.  Let me know how symptoms are doing with this.  Continue eliquis for now.

## 2020-09-22 NOTE — Assessment & Plan Note (Signed)
Orthostatic on exam today.  Will have him hold amlodipine for now and recommend monitoring BP at home.  I don't think PE is causing because he was having this prior to finding this.

## 2020-09-22 NOTE — Assessment & Plan Note (Signed)
DVT with PE.  HE will continue on eliquis for a minimum of 3 month but may choose to do this longer due to history of malignancy and this being non-provoked

## 2020-09-22 NOTE — Progress Notes (Signed)
Blake Rice - 70 y.o. male MRN [id number]  Date of birth: 1950-11-07  Subjective Chief Complaint  Patient presents with  . Hypotension    HPI Blake Rice is a 70 y.o. male here today for hospital follow up.  He was seen in ED 1.5 weeks ago for complaint of generalized weakness.  Found to have DVT and PE.  He was started on eliquis and discharged with instructions to follow up with PCP.  He is doing well with eliquis.  He denies significant side effects.  He has had some orthostasis over the past few weeks.  He has had this before and thinks this episode may me precipitated by amlodipine as it started prior to PE.  He was switched to this due to rising creatinine levels.  His blood pressure at home has been well controlled.  He denies chest pain, shortness of breath, palpitations, headache, syncope.   ROS:  A comprehensive ROS was completed and negative except as noted per HPI  Allergies  Allergen Reactions  . Oxytetracycline Other (See Comments)    Unknown FOUND WITH ALLERGY SKIN TEST AT AGE OF 6    Past Medical History:  Diagnosis Date  . BPH (benign prostatic hyperplasia) 04/08/2013  . Chronic kidney disease, stage 2, mildly decreased GFR 06/30/2018  . Dysautonomia ([place]) 09/14/2018   Striational antibody 1:960   . Dysautonomia ([place])   . Episodic lightheadedness   . History of CVA (cerebrovascular accident) 12/25/2015  . HTN (hypertension) 10/12/2013  . Irritable bowel syndrome 11/27/2016  . Mass of right parotid gland 12/03/2018   PET scan 5/59/74 - Hypermetabolic right parotid nodule on image 37 measuring 12 mm with an SUV Max of 6.2. Image 37    . PFO (patent foramen ovale) 12/25/2015  . Rosacea     Past Surgical History:  Procedure Laterality Date  . EYE SURGERY Bilateral 1999   cataract  . TRANSURETHRAL RESECTION OF BLADDER TUMOR WITH GYRUS (TURBT-GYRUS)  12/30/2016  . TRANSURETHRAL RESECTION OF PROSTATE  06/16/2017    Social History   Socioeconomic History  .  Marital status: Single    Spouse name: Not on file  . Number of children: Not on file  . Years of education: Not on file  . Highest education level: Not on file  Occupational History  . Not on file  Tobacco Use  . Smoking status: Former Smoker    Packs/day: 1.00    Years: 27.00    Pack years: 27.00    Types: Cigarettes    Quit date: 08/10/2000    Years since quitting: 20.1  . Smokeless tobacco: Never Used  Vaping Use  . Vaping Use: Never used  Substance and Sexual Activity  . Alcohol use: Not Currently  . Drug use: Never  . Sexual activity: Not Currently  Other Topics Concern  . Not on file  Social History Narrative  . Not on file   Social Determinants of Health   Financial Resource Strain: Not on file  Food Insecurity: Not on file  Transportation Needs: Not on file  Physical Activity: Not on file  Stress: Not on file  Social Connections: Not on file    Family History  Problem Relation Age of Onset  . Hypertension Father     Health Maintenance  Topic Date Due  . Hepatitis C Screening  Never done  . TETANUS/TDAP  Never done  . COLONOSCOPY (Pts 45-39yrs Insurance coverage will need to be confirmed)  Never done  . COLON CANCER SCREENING  ANNUAL FOBT  12/19/2017  . PNA vac Low Risk Adult (1 of 2 - PCV13) Never done  . COVID-19 Vaccine (3 - Pfizer risk 4-dose series) 08/27/2019  . INFLUENZA VACCINE  12/04/2020  . HPV VACCINES  Aged Out     ----------------------------------------------------------------------------------------------------------------------------------------------------------------------------------------------------------------- Physical Exam There were no vitals taken for this visit.  Physical Exam Constitutional:      Appearance: Normal appearance.  Cardiovascular:     Rate and Rhythm: Normal rate and regular rhythm.  Pulmonary:     Effort: Pulmonary effort is normal.     Breath sounds: Normal breath sounds.  Musculoskeletal:     Cervical  back: Neck supple.  Neurological:     General: No focal deficit present.     Mental Status: He is alert.  Psychiatric:        Mood and Affect: Mood normal.        Behavior: Behavior normal.     ------------------------------------------------------------------------------------------------------------------------------------------------------------------------------------------------------------------- Assessment and Plan  Orthostasis Orthostatic on exam today.  Will have him hold amlodipine for now and recommend monitoring BP at home.  I don't think PE is causing because he was having this prior to finding this.    Lower leg DVT (deep venous thromboembolism), acute, left (HCC) DVT with PE.  HE will continue on eliquis for a minimum of 3 month but may choose to do this longer due to history of malignancy and this being non-provoked   Meds ordered this encounter  Medications  . apixaban (ELIQUIS) 5 MG TABS tablet    Sig: Take 1 tablet (5 mg total) by mouth 2 (two) times daily.    Dispense:  60 tablet    Refill:  3    No follow-ups on file.    This visit occurred during the SARS-CoV-2 public health emergency.  Safety protocols were in place, including screening questions prior to the visit, additional usage of staff PPE, and extensive cleaning of exam room while observing appropriate contact time as indicated for disinfecting solutions.

## 2020-09-25 ENCOUNTER — Encounter: Payer: Self-pay | Admitting: Family Medicine

## 2020-09-26 ENCOUNTER — Encounter: Payer: Self-pay | Admitting: Family Medicine

## 2020-09-26 ENCOUNTER — Other Ambulatory Visit: Payer: Self-pay | Admitting: Family Medicine

## 2020-09-26 DIAGNOSIS — Q2112 Patent foramen ovale: Secondary | ICD-10-CM

## 2020-09-26 DIAGNOSIS — Q211 Atrial septal defect: Secondary | ICD-10-CM

## 2020-09-26 DIAGNOSIS — I2699 Other pulmonary embolism without acute cor pulmonale: Secondary | ICD-10-CM

## 2020-10-03 ENCOUNTER — Encounter: Payer: Self-pay | Admitting: Family Medicine

## 2020-10-03 ENCOUNTER — Ambulatory Visit: Payer: Medicare Other

## 2020-10-03 DIAGNOSIS — I6522 Occlusion and stenosis of left carotid artery: Secondary | ICD-10-CM | POA: Diagnosis not present

## 2020-10-03 DIAGNOSIS — R29818 Other symptoms and signs involving the nervous system: Secondary | ICD-10-CM | POA: Diagnosis not present

## 2020-10-03 DIAGNOSIS — Z8551 Personal history of malignant neoplasm of bladder: Secondary | ICD-10-CM | POA: Diagnosis not present

## 2020-10-03 DIAGNOSIS — R519 Headache, unspecified: Secondary | ICD-10-CM | POA: Diagnosis not present

## 2020-10-03 DIAGNOSIS — Z881 Allergy status to other antibiotic agents status: Secondary | ICD-10-CM | POA: Diagnosis not present

## 2020-10-03 DIAGNOSIS — Z87891 Personal history of nicotine dependence: Secondary | ICD-10-CM | POA: Diagnosis not present

## 2020-10-03 DIAGNOSIS — R531 Weakness: Secondary | ICD-10-CM | POA: Diagnosis not present

## 2020-10-03 DIAGNOSIS — K118 Other diseases of salivary glands: Secondary | ICD-10-CM | POA: Diagnosis not present

## 2020-10-03 DIAGNOSIS — Z79899 Other long term (current) drug therapy: Secondary | ICD-10-CM | POA: Diagnosis not present

## 2020-10-03 DIAGNOSIS — R55 Syncope and collapse: Secondary | ICD-10-CM | POA: Diagnosis not present

## 2020-10-03 DIAGNOSIS — Z8673 Personal history of transient ischemic attack (TIA), and cerebral infarction without residual deficits: Secondary | ICD-10-CM | POA: Diagnosis not present

## 2020-10-04 ENCOUNTER — Telehealth (HOSPITAL_COMMUNITY): Payer: Self-pay | Admitting: Family Medicine

## 2020-10-04 NOTE — Telephone Encounter (Signed)
Patient called and cancelled echocardiogram that you ordered for the reason below:  10/04/2020 1:43 PM [name], Blake A  Cancel Rsn: Patient (pt does not want to reschedule)  Order will be removed from the [facility] and if patient calls back and wants to reschedule we will reinstate the order. Thank you.

## 2020-10-06 DIAGNOSIS — D119 Benign neoplasm of major salivary gland, unspecified: Secondary | ICD-10-CM | POA: Diagnosis not present

## 2020-10-09 ENCOUNTER — Ambulatory Visit (HOSPITAL_COMMUNITY): Payer: Medicare Other

## 2020-10-16 ENCOUNTER — Emergency Department (INDEPENDENT_AMBULATORY_CARE_PROVIDER_SITE_OTHER)
Admission: RE | Admit: 2020-10-16 | Discharge: 2020-10-16 | Disposition: A | Discharge status: Home / Self Care | Payer: Medicare Other | Source: Ambulatory Visit

## 2020-10-16 ENCOUNTER — Other Ambulatory Visit: Payer: Self-pay

## 2020-10-16 VITALS — BP 134/81 | HR 89 | Temp 98.9°F | Resp 18 | Ht 70.0 in | Wt 191.0 lb

## 2020-10-16 DIAGNOSIS — R519 Headache, unspecified: Secondary | ICD-10-CM | POA: Diagnosis not present

## 2020-10-16 MED ORDER — PREDNISONE 20 MG PO TABS: 40.0000 mg | ORAL_TABLET | Freq: Every day | ORAL | 0 refills | Status: AC

## 2020-10-16 MED ORDER — DEXAMETHASONE SODIUM PHOSPHATE 10 MG/ML IJ SOLN
10.0000 mg | Freq: Once | INTRAMUSCULAR | Status: AC
  Administered 2020-10-16: 10 mg via INTRAMUSCULAR

## 2020-10-16 NOTE — ED Provider Notes (Signed)
Blake Rice C[facility]    CSN: [id number] Arrival date & time: 10/16/20  1146      History   Chief Complaint Chief Complaint  Patient presents with   Headache    HPI Blake Rice is a 70 y.o. male.   Reports new onset bilateral frontal headache that extends posteriorly to the nape of the neck daily for the last month. Denies previous symptoms. About a month ago, had hx pulmonary embolism and started Eliquis. Attributing his symptoms to this. Has also had recent adjustment to amlodipine by PCP. Headaches last for about 12 hours a day. Reports that he was running for exercise and had an episode of acute worsening headache, feeling like his head is going to "split open." States that he has seen neurologists in the past with no benefit. This is not the worst headache of their life. Has also had recent CT that was negative when seeking care for this headache. Denies SOB, numbness, tingling, loss of strength, other pain, fever, abdominal pain, nausea, vomiting, diarrhea, rash, other symptoms.  ROS per HPI  The history is provided by the patient.  Headache  Past Medical History:  Diagnosis Date   BPH (benign prostatic hyperplasia) 04/08/2013   Chronic kidney disease, stage 2, mildly decreased GFR 06/30/2018   Dysautonomia ([place]) 09/14/2018   Striational antibody 1:960    Dysautonomia ([place])    Episodic lightheadedness    History of CVA (cerebrovascular accident) 12/25/2015   HTN (hypertension) 10/12/2013   Irritable bowel syndrome 11/27/2016   Mass of right parotid gland 12/03/2018   PET scan 3/00/92 - Hypermetabolic right parotid nodule on image 37 measuring 12 mm with an SUV Max of 6.2. Image 37     PFO (patent foramen ovale) 12/25/2015   Rosacea     Patient Active Problem List   Diagnosis Date Noted   Lower leg DVT (deep venous thromboembolism), acute, left ([place]) 09/11/2020   Left pulmonary embolus ([place]) 09/11/2020   Mass of right parotid gland 12/03/2018   Chronic constipation  09/16/2018   Dysautonomia ([place]) 09/14/2018   Chronic kidney disease, stage 2, mildly decreased GFR 06/30/2018   Anxiety disorder 06/26/2018   Impacted cerumen of left ear 04/07/2018   History of vertigo 04/07/2018   History of primary bladder cancer 02/24/2018   History of prostate cancer 02/24/2018   Abdominal distension 02/24/2018   Colon cancer screening 02/24/2018   Proteinuria 02/03/2018   Urinary frequency 02/03/2018   Hx of echocardiogram 12/05/2016   Orthostasis 11/28/2016   Malignant neoplasm of overlapping sites of bladder ([place]) 11/28/2016   Episodic lightheadedness 11/27/2016   Rosacea    History of CVA (cerebrovascular accident) 12/25/2015   PFO (patent foramen ovale) 12/25/2015   HTN (hypertension) 10/12/2013   BPH with urinary obstruction 04/08/2013    Past Surgical History:  Procedure Laterality Date   EYE SURGERY Bilateral 1999   cataract   TRANSURETHRAL RESECTION OF BLADDER TUMOR WITH GYRUS (TURBT-GYRUS)  12/30/2016   TRANSURETHRAL RESECTION OF PROSTATE  06/16/2017       Home Medications    Prior to Admission medications   Medication Sig Start Date End Date Taking? Authorizing Provider  predniSONE (DELTASONE) 20 MG tablet Take 2 tablets (40 mg total) by mouth daily with breakfast for 5 days. 10/16/20 10/21/20 Yes Faustino Congress, NP  amLODipine (NORVASC) 5 MG tablet Take 1 tablet (5 mg total) by mouth daily. 08/22/20   Luetta Nutting, DO  ampicillin (PRINCIPEN) 500 MG capsule Take 1 capsule (500 mg total)  by mouth 4 (four) times daily. As needed for rosacea. 07/05/20   Luetta Nutting, DO  apixaban (ELIQUIS) 5 MG TABS tablet Take 1 tablet (5 mg total) by mouth 2 (two) times daily. 09/21/20   Luetta Nutting, DO  linaclotide [facility]) 145 MCG CAPS capsule Take 1 capsule (145 mcg total) by mouth daily before breakfast. 07/05/20   Luetta Nutting, DO    Family History Family History  Problem Relation Age of Onset   Hypertension Father     Social  History Social History   Tobacco Use   Smoking status: Former    Packs/day: 1.00    Years: 27.00    Pack years: 27.00    Types: Cigarettes    Quit date: 08/10/2000    Years since quitting: 20.1   Smokeless tobacco: Never  Vaping Use   Vaping Use: Never used  Substance Use Topics   Alcohol use: Not Currently   Drug use: Never     Allergies   Oxytetracycline   Review of Systems Review of Systems  Neurological:  Positive for headaches.    Physical Exam Triage Vital Signs ED Triage Vitals [10/16/20 1212]  Enc Vitals Group     BP 134/81     Pulse Rate 89     Resp 18     Temp 98.9 F (37.2 C)     Temp Source Oral     SpO2 99 %     Weight 191 lb (86.6 kg)     Height 5\' 10"  (1.778 m)     Head Circumference      Peak Flow      Pain Score 2     Pain Loc      Pain Edu?      Excl. in [place]?    No data found.  Updated Vital Signs BP 134/81 (BP Location: Right Arm)   Pulse 89   Temp 98.9 F (37.2 C) (Oral)   Resp 18   Ht 5\' 10"  (1.778 m)   Wt 191 lb (86.6 kg)   SpO2 99%   BMI 27.41 kg/m   Visual Acuity Right Eye Distance:   Left Eye Distance:   Bilateral Distance:    Right Eye Near:   Left Eye Near:    Bilateral Near:     Physical Exam Vitals and nursing note reviewed.  Constitutional:      General: He is not in acute distress.    Appearance: Normal appearance. He is well-developed. He is not ill-appearing.  HENT:     Head: Normocephalic and atraumatic.     Nose: Nose normal.     Mouth/Throat:     Mouth: Mucous membranes are moist.     Pharynx: Oropharynx is clear.  Eyes:     General: No visual field deficit or scleral icterus.    Extraocular Movements: Extraocular movements intact.     Right eye: Normal extraocular motion and no nystagmus.     Left eye: Normal extraocular motion and no nystagmus.     Conjunctiva/sclera: Conjunctivae normal.     Pupils: Pupils are equal, round, and reactive to light. Pupils are equal.     Right eye: Pupil is  round and reactive.     Left eye: Pupil is round and reactive.  Cardiovascular:     Rate and Rhythm: Normal rate and regular rhythm.     Heart sounds: Normal heart sounds.  Pulmonary:     Effort: Pulmonary effort is normal. No respiratory distress.  Breath sounds: No stridor. No wheezing, rhonchi or rales.  Chest:     Chest wall: No tenderness.  Musculoskeletal:        General: Normal range of motion.     Cervical back: Normal range of motion and neck supple.  Skin:    General: Skin is warm and dry.     Capillary Refill: Capillary refill takes less than 2 seconds.  Neurological:     General: No focal deficit present.     Mental Status: He is alert and oriented to person, place, and time.     Cranial Nerves: No cranial nerve deficit, dysarthria or facial asymmetry.     Sensory: No sensory deficit.     Motor: No weakness.     Coordination: Romberg sign negative. Coordination normal.     Gait: Gait normal.  Psychiatric:        Mood and Affect: Mood normal.        Behavior: Behavior normal.        Thought Content: Thought content normal.     UC Treatments / Results  Labs (all labs ordered are listed, but only abnormal results are displayed) Labs Reviewed - No data to display  EKG   Radiology No results found.  Procedures Procedures (including critical care time)  Medications Ordered in UC Medications  dexamethasone (DECADRON) injection 10 mg (10 mg Intramuscular Given 10/16/20 1315)    Initial Impression / Assessment and Plan / UC Course  I have reviewed the triage vital signs and the nursing notes.  Pertinent labs & imaging results that were available during my care of the patient were reviewed by me and considered in my medical decision making (see chart for details).    Headache Disorder  Decadron 10mg  IM in office today In combination with tylenol may help refractory headache Prednisone 40mg  daily x 5 days prescribed Discussed that this could also be New  Daily Persistent Headache and in that case, time would be the treatment of choice Etiology unclear Also discussed recent CT that was normal, and that the next imaging step may be an MRI If he chooses to go this route, follow up with PCP Follow up in the ER for acute worsening symtpoms  Final Clinical Impressions(s) / UC Diagnoses   Final diagnoses:  Headache disorder     Discharge Instructions      I do not see an apparent obvious reason for your headaches  You have received a steroid injection in the office today  I have also sent over a prednisone prescription for you to take 2 tablets daily for 5 days  I have included information on NDPH for you to look over  May continue tylenol  Continue current medication regimen  Follow up with this office or with primary care if symptoms are persisting.  Follow up in the ER for high fever, trouble swallowing, trouble breathing, other concerning symptoms.      ED Prescriptions     Medication Sig Dispense Auth. Provider   predniSONE (DELTASONE) 20 MG tablet Take 2 tablets (40 mg total) by mouth daily with breakfast for 5 days. 10 tablet Faustino Congress, NP      PDMP not reviewed this encounter.   Faustino Congress, NP 10/16/20 1706

## 2020-10-16 NOTE — ED Triage Notes (Signed)
Headaches every morning upon waking lasting 12 hours Took Tylenol with no relief Vaccinated

## 2020-10-16 NOTE — Discharge Instructions (Addendum)
I do not see an apparent obvious reason for your headaches  You have received a steroid injection in the office today  I have also sent over a prednisone prescription for you to take 2 tablets daily for 5 days  I have included information on NDPH for you to look over  May continue tylenol  Continue current medication regimen  Follow up with this office or with primary care if symptoms are persisting.  Follow up in the ER for high fever, trouble swallowing, trouble breathing, other concerning symptoms.

## 2020-10-18 DIAGNOSIS — C61 Malignant neoplasm of prostate: Secondary | ICD-10-CM | POA: Diagnosis not present

## 2020-10-18 DIAGNOSIS — Z8551 Personal history of malignant neoplasm of bladder: Secondary | ICD-10-CM | POA: Diagnosis not present

## 2020-10-19 DIAGNOSIS — C679 Malignant neoplasm of bladder, unspecified: Secondary | ICD-10-CM | POA: Diagnosis not present

## 2020-10-23 ENCOUNTER — Encounter: Payer: Self-pay | Admitting: Family Medicine

## 2020-10-23 ENCOUNTER — Other Ambulatory Visit: Payer: Self-pay

## 2020-10-23 ENCOUNTER — Ambulatory Visit (INDEPENDENT_AMBULATORY_CARE_PROVIDER_SITE_OTHER): Payer: Medicare Other | Admitting: Family Medicine

## 2020-10-23 DIAGNOSIS — I10 Essential (primary) hypertension: Secondary | ICD-10-CM | POA: Diagnosis not present

## 2020-10-23 DIAGNOSIS — R519 Headache, unspecified: Secondary | ICD-10-CM | POA: Diagnosis not present

## 2020-10-23 MED ORDER — RIVAROXABAN 20 MG PO TABS: 20.0000 mg | ORAL_TABLET | Freq: Every day | ORAL | 3 refills | Status: DC

## 2020-10-23 NOTE — Patient Instructions (Signed)
Let's try changing eliquis to Xarelto.  Let me know if this is not helping.

## 2020-10-26 ENCOUNTER — Encounter: Payer: Self-pay | Admitting: Family Medicine

## 2020-10-26 DIAGNOSIS — R519 Headache, unspecified: Secondary | ICD-10-CM

## 2020-10-26 DIAGNOSIS — R42 Dizziness and giddiness: Secondary | ICD-10-CM

## 2020-10-28 ENCOUNTER — Ambulatory Visit (INDEPENDENT_AMBULATORY_CARE_PROVIDER_SITE_OTHER): Payer: Medicare Other

## 2020-10-28 ENCOUNTER — Other Ambulatory Visit: Payer: Self-pay

## 2020-10-28 DIAGNOSIS — R42 Dizziness and giddiness: Secondary | ICD-10-CM

## 2020-10-28 DIAGNOSIS — C61 Malignant neoplasm of prostate: Secondary | ICD-10-CM | POA: Diagnosis not present

## 2020-10-28 DIAGNOSIS — R519 Headache, unspecified: Secondary | ICD-10-CM | POA: Diagnosis not present

## 2020-10-28 DIAGNOSIS — G319 Degenerative disease of nervous system, unspecified: Secondary | ICD-10-CM | POA: Diagnosis not present

## 2020-10-28 IMAGING — MR MR HEAD W/O CM
10 series · 41 of 48 positions shown · non-contrast
Comparison: No pertinent prior exams available for comparison.

CLINICAL DATA: New onset of headaches. Dizziness. Dizziness,
nonspecific; headache, new or worsening. Additional history provided
by scanning technologist: Patient reports persistent daily headaches
anterior and posterior for 6 weeks. History of bladder and prostate
cancer [N8].

EXAM:
MRI HEAD WITHOUT CONTRAST
TECHNIQUE: Multiplanar, multiecho pulse sequences of the brain and surrounding
structures were obtained without intravenous contrast.

[Series 2: DWI · axial · 3.0mm · 1.46mm/px · z∈[-24,+138]mm · 8 of 110 slices shown (1 of 4)]
[im 1/110]
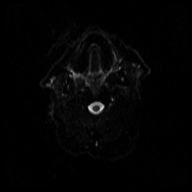
[im 16/110]
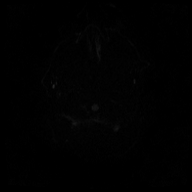
[im 32/110]
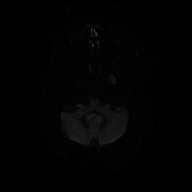
[im 47/110]
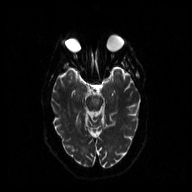
[im 63/110]
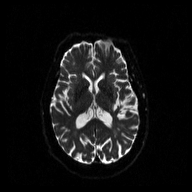
[im 78/110]
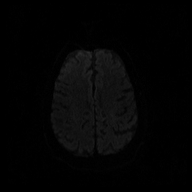
[im 94/110]
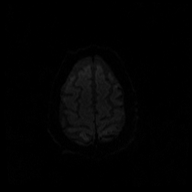
[im 110/110]
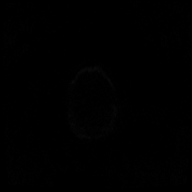

[Series 3: DWI · axial · 3.0mm · 1.46mm/px · z∈[-24,+138]mm · 5 of 55 slices shown (2 of 4)]
[im 1/55]
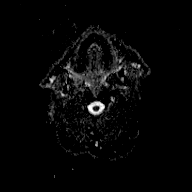
[im 14/55]
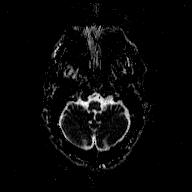
[im 28/55]
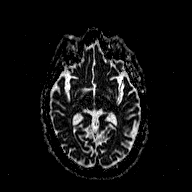
[im 41/55]
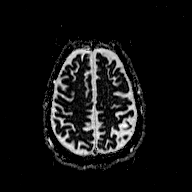
[im 55/55]
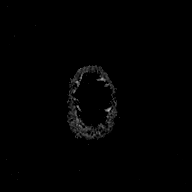

[Series 4: DWI · coronal · 5.0mm · 1.46mm/px · 5 of 64 slices shown (3 of 4)]
[im 1/64]
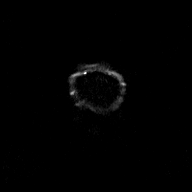
[im 16/64]
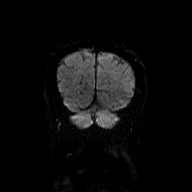
[im 32/64]
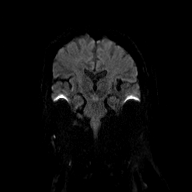
[im 48/64]
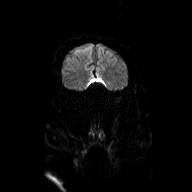
[im 64/64]
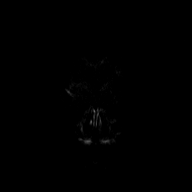

[Series 5: DWI · coronal · 5.0mm · 1.46mm/px · 3 of 32 slices shown (4 of 4)]
[im 1/32]
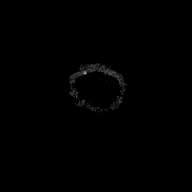
[im 16/32]
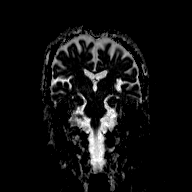
[im 32/32]
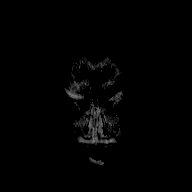

[Series 6: T1 · sagittal · 5.0mm · 0.49mm/px · 2 of 23 slices shown (1 of 2)]
[im 1/23]
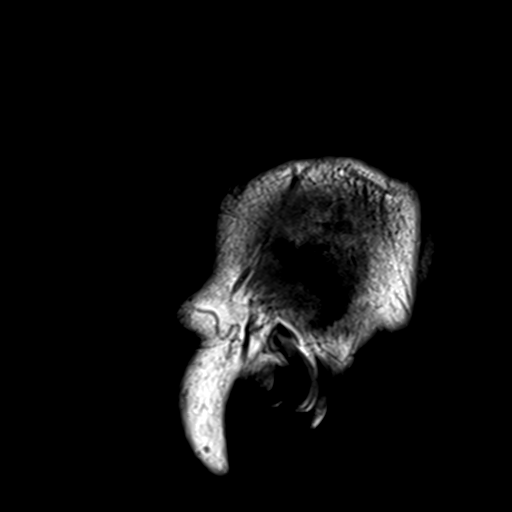
[im 23/23]
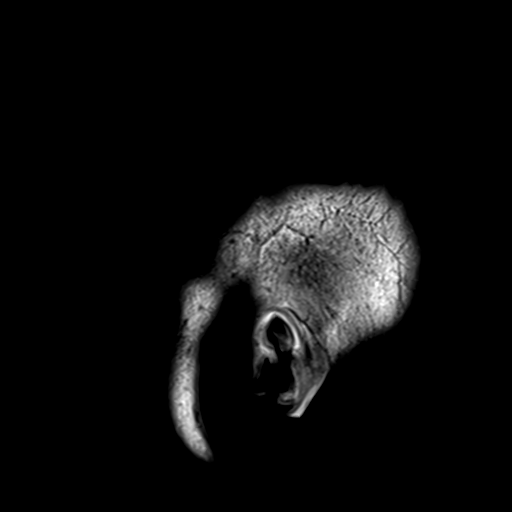

[Series 7: T2 · axial · 5.0mm · 0.75mm/px · z∈[-25,+136]mm · 2 of 24 slices shown (1 of 3)]
[im 1/24]
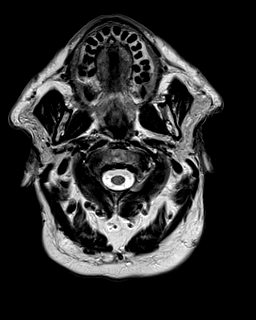
[im 24/24]
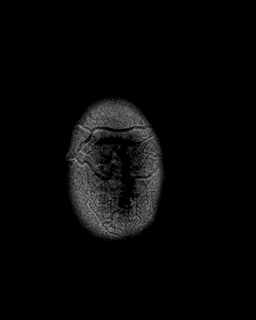

[Series 8: FLAIR · axial · 3.0mm · 0.47mm/px · z∈[-24,+137]mm · 5 of 55 slices shown]
[im 1/55]
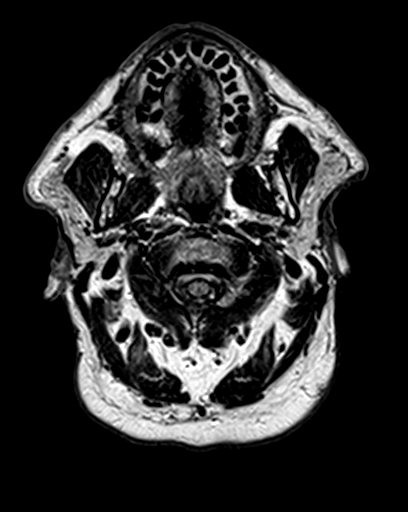
[im 14/55]
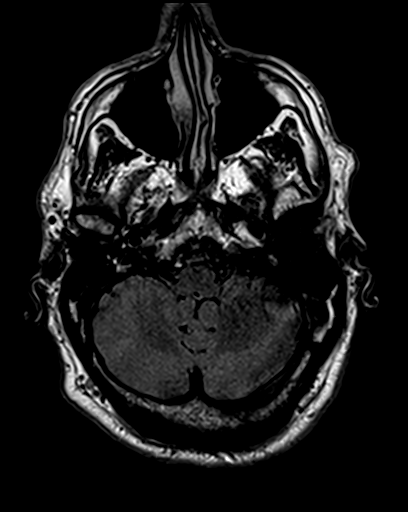
[im 28/55]
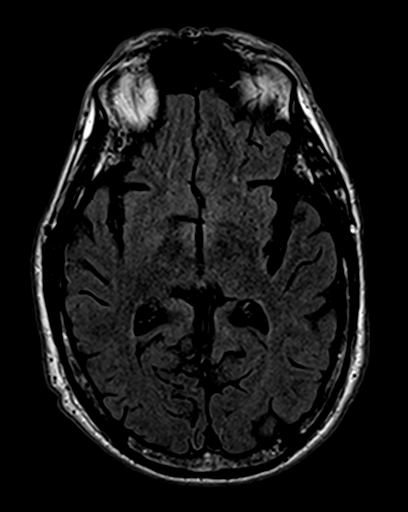
[im 41/55]
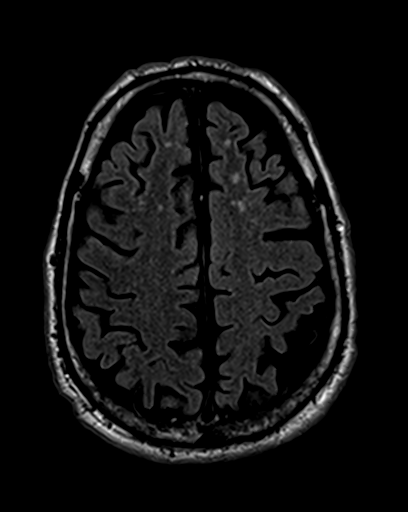
[im 55/55]
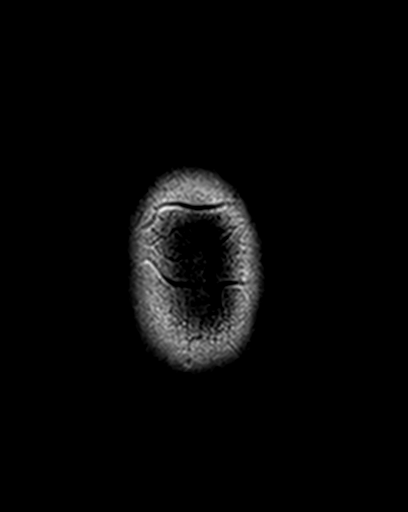

[Series 9: T2 · axial · 5.0mm · 0.72mm/px · z∈[-24,+136]mm · 2 of 24 slices shown (2 of 3)]
[im 1/24]
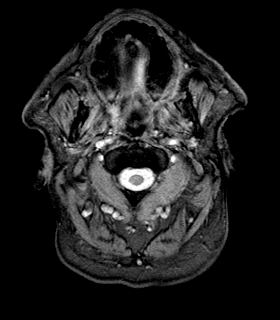
[im 24/24]
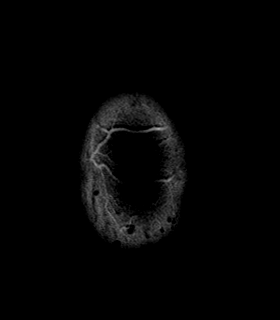

[Series 10: T1 · axial · 1.0mm · 0.47mm/px · z∈[-23,+83]mm · 6 of 160 slices shown (2 of 2)]
[im 1/160]
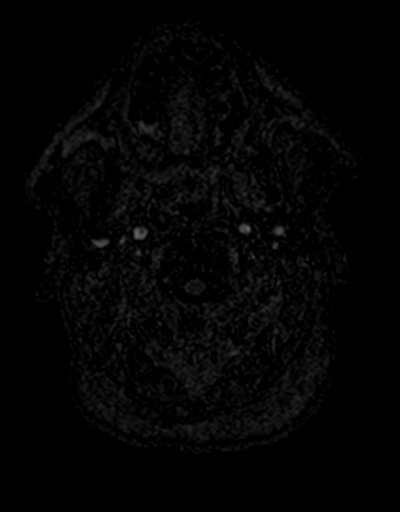
[im 27/160]
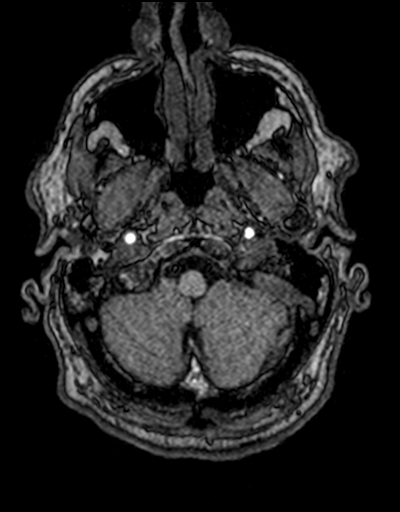
[im 54/160]
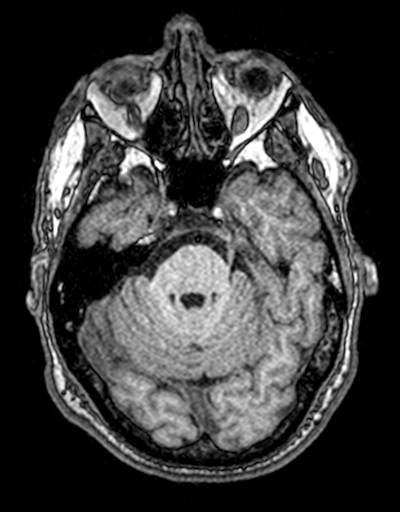
[im 67/160]
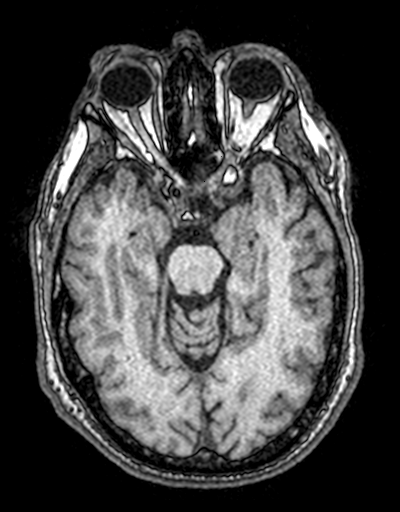
[im 93/160]
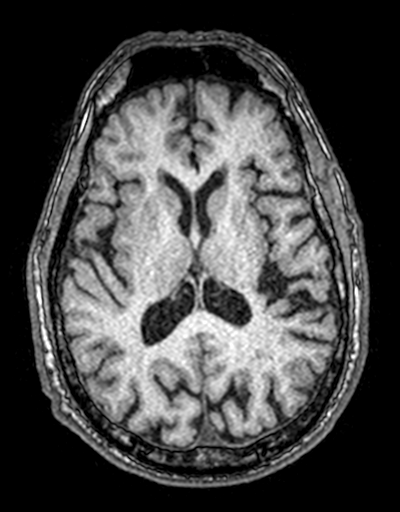
[im 107/160]
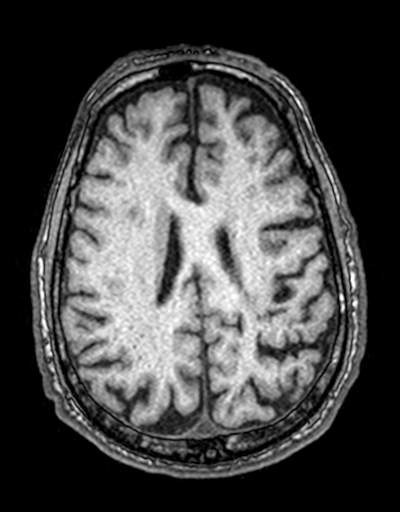

[Series 11: T2 · coronal · 5.0mm · 0.43mm/px · 3 of 32 slices shown (3 of 3)]
[im 1/32]
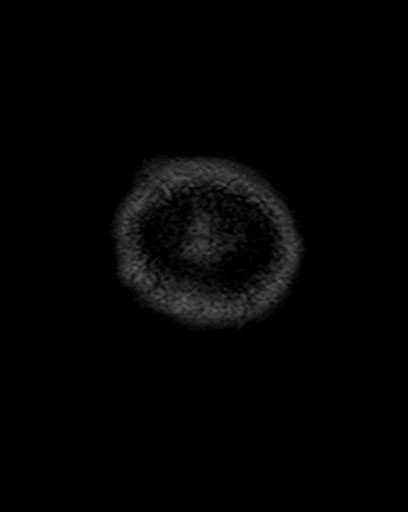
[im 16/32]
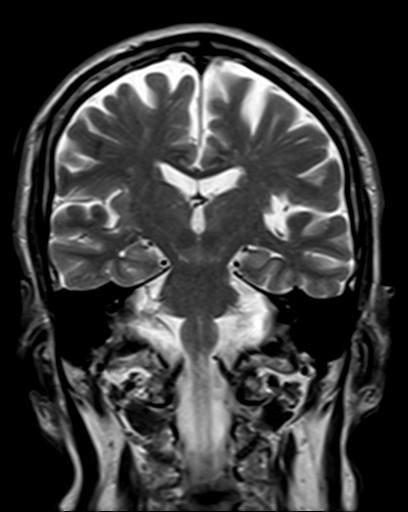
[im 32/32]
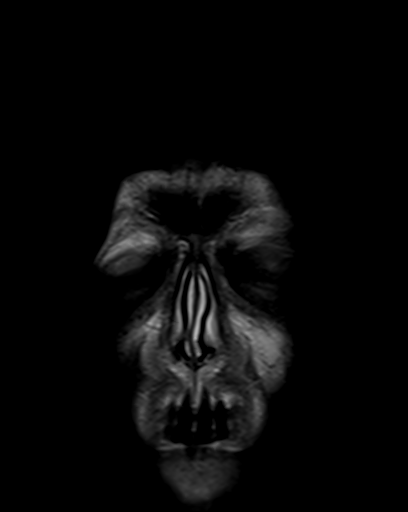

[41 of 48 positions shown; findings below may reference images not displayed]

FINDINGS: Brain:

Mild-to-moderate generalized cerebral atrophy. Comparatively mild
cerebellar atrophy.

2 mm focus of diffusion weighted hyperintensity within the right
parietal lobe periventricular white matter (series 2, image 85).

Small chronic cortically based infarcts within the left
frontoparietal lobes and posterior left insula (MCA vascular
territory).

Mild multifocal T2/FLAIR hyperintensity within the cerebral white
matter, nonspecific but compatible with chronic small vessel
ischemic disease.

No evidence of an intracranial mass.

No chronic intracranial blood products.

No extra-axial fluid collection.

No midline shift.

Vascular: Expected proximal arterial flow voids.

Skull and upper cervical spine: No focal marrow lesion.

Sinuses/Orbits: Visualized orbits show no acute finding. Trace
bilateral ethmoid sinus mucosal thickening. Small left maxillary
sinus mucous retention cyst.

These results will be called to the ordering clinician or
representative by the Radiologist Assistant, and communication
documented in the PACS or [REDACTED].
IMPRESSION: 2 mm focus of diffusion weighted hyperintensity within the right
parietal lobe periventricular white matter. This may reflect a
punctate subacute small vessel infarct. However, given the patient's
history, a tiny metastasis cannot be excluded and 6-week
contrast-enhanced MRI follow-up is recommended.

Small chronic cortically based infarcts within the left
frontoparietal lobes and posterior left insula (MCA vascular
territory).

Mild cerebral white matter chronic small vessel ischemic disease.

Mild-to-moderate generalized cerebral atrophy. Comparatively mild
cerebellar atrophy.

## 2020-10-29 DIAGNOSIS — R519 Headache, unspecified: Secondary | ICD-10-CM | POA: Insufficient documentation

## 2020-10-29 NOTE — Assessment & Plan Note (Signed)
Given migraine cocktail at recent ED visit without improvement.  He correlates onset of symptoms with starting eliquis.  Will see if change to Xarelto makes a difference.  If not improving we discussed obtaining an MRI of the brain and/or neurology referral for further evaluation

## 2020-10-29 NOTE — Assessment & Plan Note (Signed)
BP elevated again today.  Holding amlodipine did not make a difference in symptoms.  Advised that he restart this.

## 2020-10-29 NOTE — Progress Notes (Signed)
Blake Rice - 70 y.o. male MRN [id number]  Date of birth: [birth year]  Subjective Chief Complaint  Patient presents with   Headache    HPI Blake Rice is a 70 y.o. male here today with complaint of headache.  He has had increased headaches and episodes of dizziness for a few weeks now.  He correlates this to around the time that he started Eliquis for DVT/PE about 1.5 months ago.  Headaches radiate throughout his entire head and neck.  He denies new vision changes, weakness, numbness, tingling.  He has tried holding amlodipine without significant improvement in his symptoms.  Echocardiogram completed when dx with PE was unremarkable.   ROS:  A comprehensive ROS was completed and negative except as noted per HPI  Allergies  Allergen Reactions   Oxytetracycline Other (See Comments)    Unknown FOUND WITH ALLERGY SKIN TEST AT AGE OF 6    Past Medical History:  Diagnosis Date   BPH (benign prostatic hyperplasia) 04/08/2013   Chronic kidney disease, stage 2, mildly decreased GFR 06/30/2018   Dysautonomia ([place]) 09/14/2018   Striational antibody 1:960    Dysautonomia ([facility])    Episodic lightheadedness    History of CVA (cerebrovascular accident) 12/25/2015   HTN (hypertension) 10/12/2013   Irritable bowel syndrome 11/27/2016   Mass of right parotid gland 12/03/2018   PET scan 2/44/01 - Hypermetabolic right parotid nodule on image 37 measuring 12 mm with an SUV Max of 6.2. Image 37     PFO (patent foramen ovale) 12/25/2015   Rosacea     Past Surgical History:  Procedure Laterality Date   EYE SURGERY Bilateral 1999   cataract   TRANSURETHRAL RESECTION OF BLADDER TUMOR WITH GYRUS (TURBT-GYRUS)  12/30/2016   TRANSURETHRAL RESECTION OF PROSTATE  06/16/2017    Social History   Socioeconomic History   Marital status: Single    Spouse name: Not on file   Number of children: Not on file   Years of education: Not on file   Highest education level: Not on file  Occupational History    Not on file  Tobacco Use   Smoking status: Former    Packs/day: 1.00    Years: 27.00    Pack years: 27.00    Types: Cigarettes    Quit date: 08/10/2000    Years since quitting: 20.2   Smokeless tobacco: Never  Vaping Use   Vaping Use: Never used  Substance and Sexual Activity   Alcohol use: Not Currently   Drug use: Never   Sexual activity: Not Currently  Other Topics Concern   Not on file  Social History Narrative   Not on file   Social Determinants of Health   Financial Resource Strain: Not on file  Food Insecurity: Not on file  Transportation Needs: Not on file  Physical Activity: Not on file  Stress: Not on file  Social Connections: Not on file    Family History  Problem Relation Age of Onset   Hypertension Father     Health Maintenance  Topic Date Due   Hepatitis C Screening  Never done   TETANUS/TDAP  Never done   Zoster Vaccines- Shingrix (1 of 2) Never done   COLONOSCOPY (Pts 45-58yrs Insurance coverage will need to be confirmed)  Never done   COLON CANCER SCREENING ANNUAL FOBT  12/19/2017   PNA vac Low Risk Adult (1 of 2 - PCV13) Never done   COVID-19 Vaccine (3 - Pfizer risk series) 08/27/2019   INFLUENZA VACCINE  12/04/2020   HPV VACCINES  Aged Out     ----------------------------------------------------------------------------------------------------------------------------------------------------------------------------------------------------------------- Physical Exam BP (!) 155/73 (BP Location: Left Arm, Patient Position: Sitting, Cuff Size: Normal)   Pulse 74   Temp 98 F (36.7 C)   Ht 5\' 10"  (1.778 m)   Wt 192 lb (87.1 kg)   SpO2 100%   BMI 27.55 kg/m   Physical Exam Constitutional:      Appearance: He is well-developed.  Cardiovascular:     Rate and Rhythm: Normal rate and regular rhythm.  Pulmonary:     Effort: Pulmonary effort is normal.     Breath sounds: Normal breath sounds.  Musculoskeletal:     Cervical back: Normal range  of motion and neck supple.  Skin:    General: Skin is warm and dry.  Neurological:     General: No focal deficit present.     Mental Status: He is alert.     Cranial Nerves: No cranial nerve deficit.     Motor: No weakness.     Gait: Gait normal.  Psychiatric:        Mood and Affect: Mood normal.        Behavior: Behavior normal.    ------------------------------------------------------------------------------------------------------------------------------------------------------------------------------------------------------------------- Assessment and Plan  Headache Given migraine cocktail at recent ED visit without improvement.  He correlates onset of symptoms with starting eliquis.  Will see if change to Xarelto makes a difference.  If not improving we discussed obtaining an MRI of the brain and/or neurology referral for further evaluation  HTN (hypertension) BP elevated again today.  Holding amlodipine did not make a difference in symptoms.  Advised that he restart this.     Meds ordered this encounter  Medications   rivaroxaban (XARELTO) 20 MG TABS tablet    Sig: Take 1 tablet (20 mg total) by mouth daily with supper.    Dispense:  30 tablet    Refill:  3    No follow-ups on file.    This visit occurred during the SARS-CoV-2 public health emergency.  Safety protocols were in place, including screening questions prior to the visit, additional usage of staff PPE, and extensive cleaning of exam room while observing appropriate contact time as indicated for disinfecting solutions.

## 2020-10-30 ENCOUNTER — Telehealth: Payer: Self-pay

## 2020-10-30 NOTE — Telephone Encounter (Signed)
Received vm for callback from [facility] Radiology concerning brain scan.   Per Radiology, possible small vessel infarct and they cannot rule out a tumor.    Suggesting 6 week contrast follow-up.   Dr. Zigmund Daniel has been advised.

## 2020-11-01 ENCOUNTER — Encounter: Payer: Self-pay | Admitting: Family Medicine

## 2020-11-06 ENCOUNTER — Other Ambulatory Visit: Payer: Self-pay | Admitting: Family Medicine

## 2020-11-08 ENCOUNTER — Encounter: Payer: Self-pay | Admitting: Family Medicine

## 2020-11-09 MED ORDER — AMLODIPINE BESYLATE 5 MG PO TABS: 5.0000 mg | ORAL_TABLET | Freq: Every day | ORAL | 1 refills | Status: DC

## 2020-11-12 ENCOUNTER — Encounter: Payer: Self-pay | Admitting: Family Medicine

## 2020-11-22 DIAGNOSIS — Z888 Allergy status to other drugs, medicaments and biological substances status: Secondary | ICD-10-CM | POA: Diagnosis not present

## 2020-11-22 DIAGNOSIS — Z87891 Personal history of nicotine dependence: Secondary | ICD-10-CM | POA: Diagnosis not present

## 2020-11-22 DIAGNOSIS — R42 Dizziness and giddiness: Secondary | ICD-10-CM | POA: Diagnosis not present

## 2020-11-22 DIAGNOSIS — R519 Headache, unspecified: Secondary | ICD-10-CM | POA: Diagnosis not present

## 2020-11-22 DIAGNOSIS — Z7901 Long term (current) use of anticoagulants: Secondary | ICD-10-CM | POA: Diagnosis not present

## 2020-11-22 DIAGNOSIS — Z79899 Other long term (current) drug therapy: Secondary | ICD-10-CM | POA: Diagnosis not present

## 2020-11-28 DIAGNOSIS — C679 Malignant neoplasm of bladder, unspecified: Secondary | ICD-10-CM | POA: Diagnosis not present

## 2020-11-28 DIAGNOSIS — R519 Headache, unspecified: Secondary | ICD-10-CM | POA: Diagnosis not present

## 2020-11-28 DIAGNOSIS — Z8673 Personal history of transient ischemic attack (TIA), and cerebral infarction without residual deficits: Secondary | ICD-10-CM | POA: Diagnosis not present

## 2020-11-28 DIAGNOSIS — Q211 Atrial septal defect: Secondary | ICD-10-CM | POA: Diagnosis not present

## 2020-11-28 DIAGNOSIS — I2699 Other pulmonary embolism without acute cor pulmonale: Secondary | ICD-10-CM | POA: Diagnosis not present

## 2020-11-28 DIAGNOSIS — I824Z2 Acute embolism and thrombosis of unspecified deep veins of left distal lower extremity: Secondary | ICD-10-CM | POA: Diagnosis not present

## 2020-11-30 ENCOUNTER — Encounter: Payer: Self-pay | Admitting: Family Medicine

## 2020-12-06 DIAGNOSIS — Z888 Allergy status to other drugs, medicaments and biological substances status: Secondary | ICD-10-CM | POA: Diagnosis not present

## 2020-12-06 DIAGNOSIS — Z7901 Long term (current) use of anticoagulants: Secondary | ICD-10-CM | POA: Diagnosis not present

## 2020-12-06 DIAGNOSIS — R42 Dizziness and giddiness: Secondary | ICD-10-CM | POA: Diagnosis not present

## 2020-12-06 DIAGNOSIS — R519 Headache, unspecified: Secondary | ICD-10-CM | POA: Diagnosis not present

## 2020-12-06 DIAGNOSIS — G4489 Other headache syndrome: Secondary | ICD-10-CM | POA: Diagnosis not present

## 2020-12-06 DIAGNOSIS — F419 Anxiety disorder, unspecified: Secondary | ICD-10-CM | POA: Diagnosis not present

## 2020-12-06 DIAGNOSIS — Z8673 Personal history of transient ischemic attack (TIA), and cerebral infarction without residual deficits: Secondary | ICD-10-CM | POA: Diagnosis not present

## 2020-12-06 DIAGNOSIS — Z79899 Other long term (current) drug therapy: Secondary | ICD-10-CM | POA: Diagnosis not present

## 2020-12-06 DIAGNOSIS — Z8551 Personal history of malignant neoplasm of bladder: Secondary | ICD-10-CM | POA: Diagnosis not present

## 2020-12-06 DIAGNOSIS — Z87891 Personal history of nicotine dependence: Secondary | ICD-10-CM | POA: Diagnosis not present

## 2020-12-07 DIAGNOSIS — R519 Headache, unspecified: Secondary | ICD-10-CM | POA: Diagnosis not present

## 2020-12-07 DIAGNOSIS — Z8673 Personal history of transient ischemic attack (TIA), and cerebral infarction without residual deficits: Secondary | ICD-10-CM | POA: Diagnosis not present

## 2020-12-07 DIAGNOSIS — I2699 Other pulmonary embolism without acute cor pulmonale: Secondary | ICD-10-CM | POA: Diagnosis not present

## 2020-12-07 DIAGNOSIS — I824Z2 Acute embolism and thrombosis of unspecified deep veins of left distal lower extremity: Secondary | ICD-10-CM | POA: Diagnosis not present

## 2020-12-08 ENCOUNTER — Encounter: Payer: Self-pay | Admitting: Family Medicine

## 2020-12-10 DIAGNOSIS — R42 Dizziness and giddiness: Secondary | ICD-10-CM | POA: Diagnosis not present

## 2020-12-10 DIAGNOSIS — Z8673 Personal history of transient ischemic attack (TIA), and cerebral infarction without residual deficits: Secondary | ICD-10-CM | POA: Diagnosis not present

## 2020-12-10 DIAGNOSIS — Z881 Allergy status to other antibiotic agents status: Secondary | ICD-10-CM | POA: Diagnosis not present

## 2020-12-10 DIAGNOSIS — Z87891 Personal history of nicotine dependence: Secondary | ICD-10-CM | POA: Diagnosis not present

## 2020-12-10 DIAGNOSIS — Z7901 Long term (current) use of anticoagulants: Secondary | ICD-10-CM | POA: Diagnosis not present

## 2020-12-10 DIAGNOSIS — Z86718 Personal history of other venous thrombosis and embolism: Secondary | ICD-10-CM | POA: Diagnosis not present

## 2020-12-10 DIAGNOSIS — Z8551 Personal history of malignant neoplasm of bladder: Secondary | ICD-10-CM | POA: Diagnosis not present

## 2020-12-10 DIAGNOSIS — F419 Anxiety disorder, unspecified: Secondary | ICD-10-CM | POA: Diagnosis not present

## 2020-12-10 DIAGNOSIS — Z79899 Other long term (current) drug therapy: Secondary | ICD-10-CM | POA: Diagnosis not present

## 2020-12-10 DIAGNOSIS — Z86711 Personal history of pulmonary embolism: Secondary | ICD-10-CM | POA: Diagnosis not present

## 2020-12-11 ENCOUNTER — Other Ambulatory Visit: Payer: Self-pay | Admitting: Family Medicine

## 2020-12-11 DIAGNOSIS — G939 Disorder of brain, unspecified: Secondary | ICD-10-CM

## 2020-12-11 DIAGNOSIS — K119 Disease of salivary gland, unspecified: Secondary | ICD-10-CM

## 2020-12-11 DIAGNOSIS — Z8052 Family history of malignant neoplasm of bladder: Secondary | ICD-10-CM

## 2020-12-14 ENCOUNTER — Ambulatory Visit (INDEPENDENT_AMBULATORY_CARE_PROVIDER_SITE_OTHER): Payer: Medicare Other | Admitting: Family Medicine

## 2020-12-14 ENCOUNTER — Encounter: Payer: Self-pay | Admitting: Family Medicine

## 2020-12-14 DIAGNOSIS — R519 Headache, unspecified: Secondary | ICD-10-CM | POA: Diagnosis not present

## 2020-12-14 DIAGNOSIS — K118 Other diseases of salivary glands: Secondary | ICD-10-CM

## 2020-12-14 MED ORDER — DIPHENHYDRAMINE HCL 25 MG PO CAPS: 25.0000 mg | ORAL_CAPSULE | Freq: Three times a day (TID) | ORAL | 0 refills | Status: DC | PRN

## 2020-12-14 MED ORDER — MECLIZINE HCL 25 MG PO TABS: 25.0000 mg | ORAL_TABLET | Freq: Three times a day (TID) | ORAL | 0 refills | Status: DC | PRN

## 2020-12-14 NOTE — Progress Notes (Signed)
Blake Rice - 70 y.o. male MRN [id number]  Date of birth: 07/16/1950  Subjective Chief Complaint  Patient presents with   Hospitalization Follow-up    HPI Blake Rice is a 70 year old male here today for recent ER follow-up.  He has had problems with recurrent headache over the past several weeks.  He has seen neurology and had extensive work-up including recent MRV due to concern of venous sinus thrombosis.  This fortunately was negative.  He is scheduled to have repeat MRI for small lesion noted on previous MRI.  At his most recent ER visit he states he was given a combination of Antivert and Benadryl.  He reports that this has provided the most relief he has had since the onset of his symptoms several weeks ago.  He was discharged with clonazepam which he is using intermittently over the past week.  He does feel like the meclizine and Benadryl worked better for him.  He did not have any significant side effects related to this.  ROS:  A comprehensive ROS was completed and negative except as noted per HPI  Allergies  Allergen Reactions   Oxytetracycline Other (See Comments)    Unknown FOUND WITH ALLERGY SKIN TEST AT AGE OF 6    Past Medical History:  Diagnosis Date   BPH (benign prostatic hyperplasia) 04/08/2013   Chronic kidney disease, stage 2, mildly decreased GFR 06/30/2018   Dysautonomia ([facility]) 09/14/2018   Striational antibody 1:960    Dysautonomia ([facility])    Episodic lightheadedness    History of CVA (cerebrovascular accident) 12/25/2015   HTN (hypertension) 10/12/2013   Irritable bowel syndrome 11/27/2016   Mass of right parotid gland 12/03/2018   PET scan [id number] - Hypermetabolic right parotid nodule on image 37 measuring 12 mm with an SUV Max of 6.2. Image 37     PFO (patent foramen ovale) 12/25/2015   Rosacea     Past Surgical History:  Procedure Laterality Date   EYE SURGERY Bilateral 1999   cataract   TRANSURETHRAL RESECTION OF BLADDER TUMOR WITH GYRUS (TURBT-GYRUS)   12/30/2016   TRANSURETHRAL RESECTION OF PROSTATE  06/16/2017    Social History   Socioeconomic History   Marital status: Single    Spouse name: Not on file   Number of children: Not on file   Years of education: Not on file   Highest education level: Not on file  Occupational History   Not on file  Tobacco Use   Smoking status: Former    Packs/day: 1.00    Years: 27.00    Pack years: 27.00    Types: Cigarettes    Quit date: 08/10/2000    Years since quitting: 20.3   Smokeless tobacco: Never  Vaping Use   Vaping Use: Never used  Substance and Sexual Activity   Alcohol use: Not Currently   Drug use: Never   Sexual activity: Not Currently  Other Topics Concern   Not on file  Social History Narrative   Not on file   Social Determinants of Health   Financial Resource Strain: Not on file  Food Insecurity: Not on file  Transportation Needs: Not on file  Physical Activity: Not on file  Stress: Not on file  Social Connections: Not on file    Family History  Problem Relation Age of Onset   Hypertension Father     Health Maintenance  Topic Date Due   Hepatitis C Screening  Never done   TETANUS/TDAP  Never done   Zoster Vaccines- Shingrix (  1 of 2) Never done   COLONOSCOPY (Pts 45-77yr Insurance coverage will need to be confirmed)  Never done   COLON CANCER SCREENING ANNUAL FOBT  12/19/2017   PNA vac Low Risk Adult (1 of 2 - PCV13) Never done   COVID-19 Vaccine (3 - Pfizer risk series) 08/27/2019   INFLUENZA VACCINE  12/04/2020   HPV VACCINES  Aged Out     ----------------------------------------------------------------------------------------------------------------------------------------------------------------------------------------------------------------- Physical Exam BP 116/69 (BP Location: Left Arm, Patient Position: Sitting, Cuff Size: Normal)   Pulse 86   Ht '5\' 10"'$  (1.778 m)   Wt 179 lb (81.2 kg)   SpO2 99%   BMI 25.68 kg/m   Physical  Exam Constitutional:      Appearance: Normal appearance.  HENT:     Head: Normocephalic and atraumatic.  Eyes:     General: No scleral icterus. Cardiovascular:     Rate and Rhythm: Normal rate and regular rhythm.  Pulmonary:     Effort: Pulmonary effort is normal.     Breath sounds: Normal breath sounds.  Musculoskeletal:     Cervical back: Neck supple.  Neurological:     General: No focal deficit present.     Mental Status: He is alert.  Psychiatric:        Mood and Affect: Mood normal.        Behavior: Behavior normal.    ------------------------------------------------------------------------------------------------------------------------------------------------------------------------------------------------------------------- Assessment and Plan  Mass of right parotid gland He has already had this followed up by ENT, stable at this time.  Headache He continues to have intermittent headaches and dizziness which have improved since his most recent hospital visit.  He had quite a bit of improvement with combination of meclizine and Benadryl.  We will add these back on as a seem to control his symptoms fairly well.  He will let me know if these become ineffective.  He will also plan to have MRI with contrast for follow-up of brain lesion and continue to see neurology.   Meds ordered this encounter  Medications   meclizine (ANTIVERT) 25 MG tablet    Sig: Take 1 tablet (25 mg total) by mouth 3 (three) times daily as needed for dizziness.    Dispense:  30 tablet    Refill:  0   diphenhydrAMINE (BENADRYL) 25 mg capsule    Sig: Take 1 capsule (25 mg total) by mouth every 8 (eight) hours as needed. Take in combination with meclizine    Dispense:  30 capsule    Refill:  0    No follow-ups on file.    This visit occurred during the SARS-CoV-2 public health emergency.  Safety protocols were in place, including screening questions prior to the visit, additional usage of staff  PPE, and extensive cleaning of exam room while observing appropriate contact time as indicated for disinfecting solutions.

## 2020-12-14 NOTE — Assessment & Plan Note (Signed)
He has already had this followed up by ENT, stable at this time.

## 2020-12-14 NOTE — Assessment & Plan Note (Signed)
He continues to have intermittent headaches and dizziness which have improved since his most recent hospital visit.  He had quite a bit of improvement with combination of meclizine and Benadryl.  We will add these back on as a seem to control his symptoms fairly well.  He will let me know if these become ineffective.  He will also plan to have MRI with contrast for follow-up of brain lesion and continue to see neurology.

## 2020-12-18 ENCOUNTER — Ambulatory Visit (INDEPENDENT_AMBULATORY_CARE_PROVIDER_SITE_OTHER): Payer: Medicare Other

## 2020-12-18 ENCOUNTER — Encounter: Payer: Self-pay | Admitting: Family Medicine

## 2020-12-18 ENCOUNTER — Other Ambulatory Visit: Payer: Self-pay

## 2020-12-18 DIAGNOSIS — Z8052 Family history of malignant neoplasm of bladder: Secondary | ICD-10-CM

## 2020-12-18 DIAGNOSIS — G939 Disorder of brain, unspecified: Secondary | ICD-10-CM

## 2020-12-18 DIAGNOSIS — R519 Headache, unspecified: Secondary | ICD-10-CM | POA: Diagnosis not present

## 2020-12-18 DIAGNOSIS — R42 Dizziness and giddiness: Secondary | ICD-10-CM | POA: Diagnosis not present

## 2020-12-18 DIAGNOSIS — Z8551 Personal history of malignant neoplasm of bladder: Secondary | ICD-10-CM | POA: Diagnosis not present

## 2020-12-18 IMAGING — MR MR HEAD WO/W CM
13 series · 48 of 48 positions shown · IV contrast (gadavist)
Comparison: [DATE]

CLINICAL DATA: Brain mass or lesion. History of bladder cancer with
headaches and vertigo

EXAM:
MRI HEAD WITHOUT AND WITH CONTRAST
TECHNIQUE: Multiplanar, multiecho pulse sequences of the brain and surrounding
structures were obtained without and with intravenous contrast.
CONTRAST:  10mL GADAVIST GADOBUTROL 1 MMOL/ML IV SOLN

[Series 2: DWI · axial · 3.0mm · 1.46mm/px · z∈[-64,+100]mm · 7 of 106 slices shown (1 of 4)]
[im 1/106]
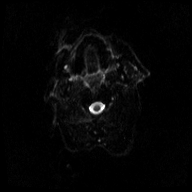
[im 18/106]
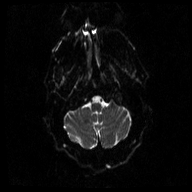
[im 36/106]
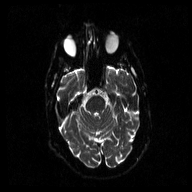
[im 53/106]
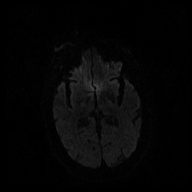
[im 71/106]
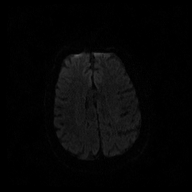
[im 88/106]
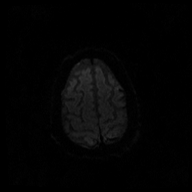
[im 106/106]
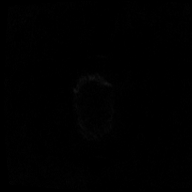

[Series 3: DWI · axial · 3.0mm · 1.46mm/px · z∈[-64,+100]mm · 3 of 55 slices shown (2 of 4)]
[im 1/55]
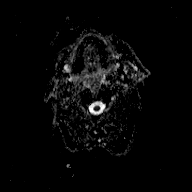
[im 28/55]
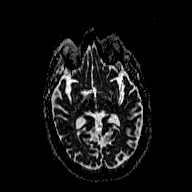
[im 55/55]
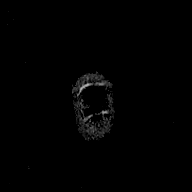

[Series 4: DWI · coronal · 3.0mm · 1.46mm/px · 6 of 104 slices shown (3 of 4)]
[im 1/104]
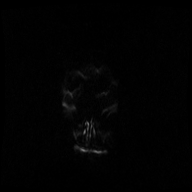
[im 21/104]
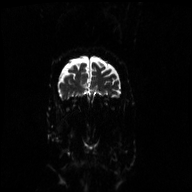
[im 42/104]
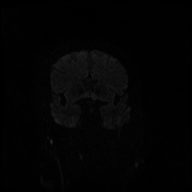
[im 62/104]
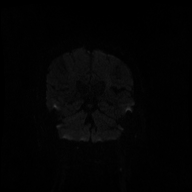
[im 83/104]
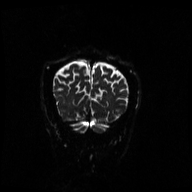
[im 104/104]
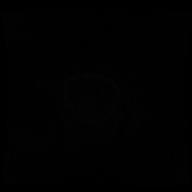

[Series 5: DWI · coronal · 3.0mm · 1.46mm/px · 3 of 51 slices shown (4 of 4)]
[im 1/51]
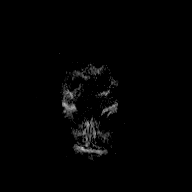
[im 26/51]
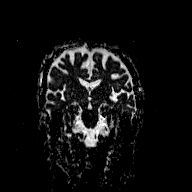
[im 51/51]
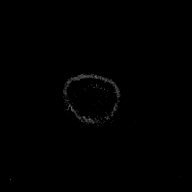

[Series 6: T1 · sagittal · 5.0mm · 0.45mm/px · 1 of 23 slices shown (1 of 2)]
[im 1/23]
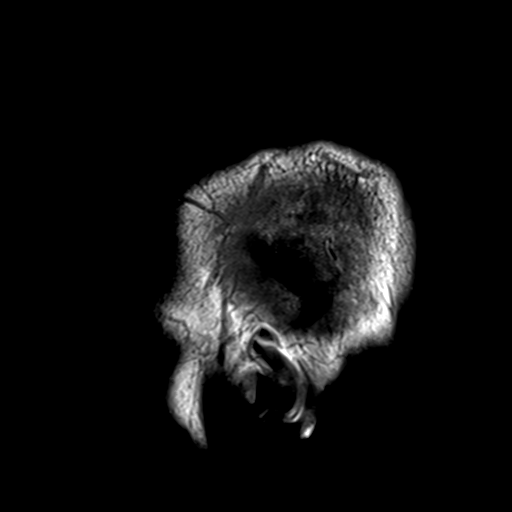

[Series 7: T2 · axial · 5.0mm · 0.36mm/px · 1 of 25 slices shown (1 of 2)]
[im 1/25]
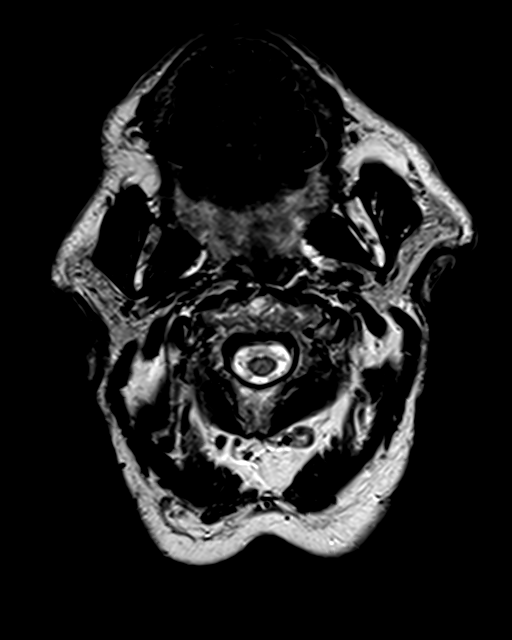

[Series 8: FLAIR · axial · 3.0mm · 0.45mm/px · z∈[-46,+118]mm · 3 of 56 slices shown]
[im 1/56]
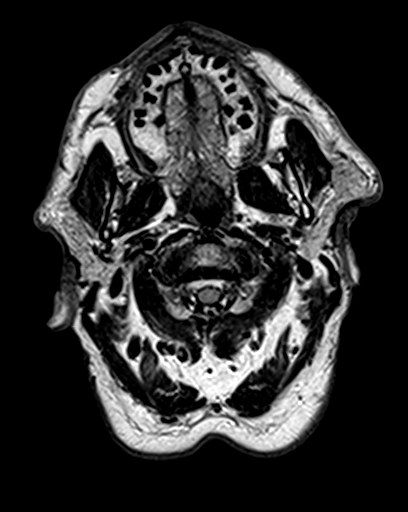
[im 28/56]
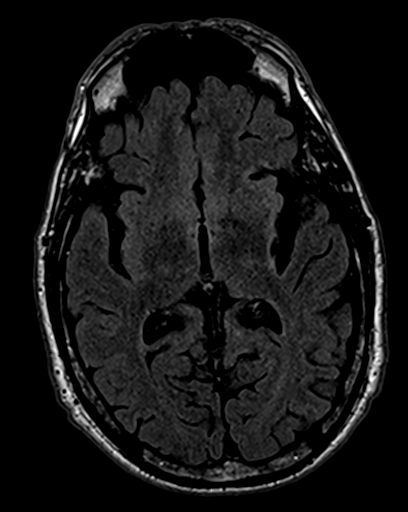
[im 56/56]
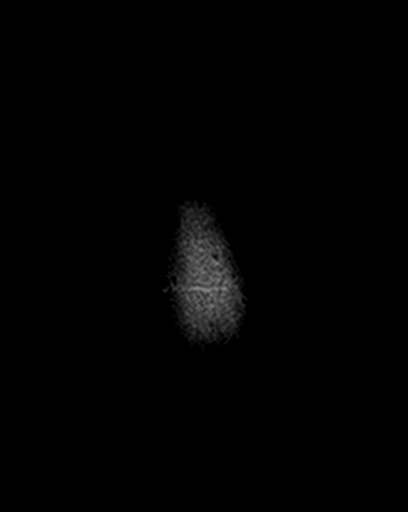

[Series 9: T2 · axial · 5.0mm · 0.72mm/px · 1 of 25 slices shown (2 of 2)]
[im 1/25]
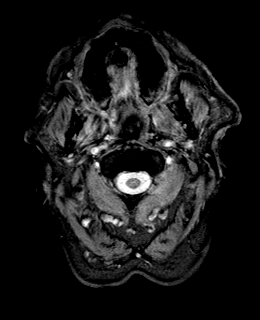

[Series 10: T1 · axial · 1.0mm · 0.94mm/px · z∈[-37,+121]mm · 9 of 160 slices shown (2 of 2)]
[im 1/160]
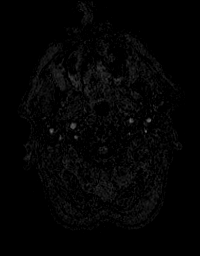
[im 20/160]
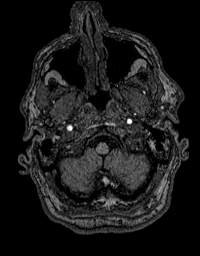
[im 40/160]
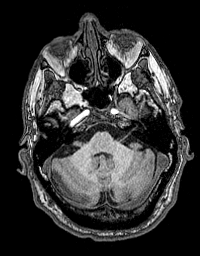
[im 60/160]
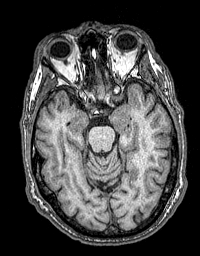
[im 80/160]
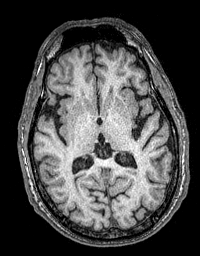
[im 100/160]
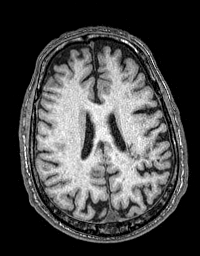
[im 120/160]
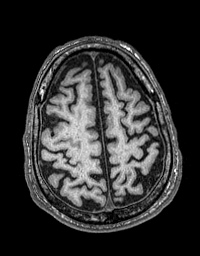
[im 140/160]
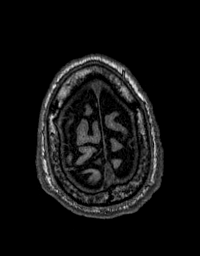
[im 160/160]
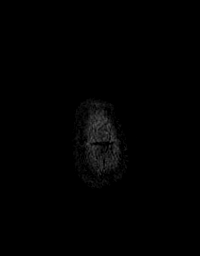

[Series 11: T2 post-contrast · coronal · 5.0mm · 0.43mm/px · 2 of 32 slices shown]
[im 1/32]
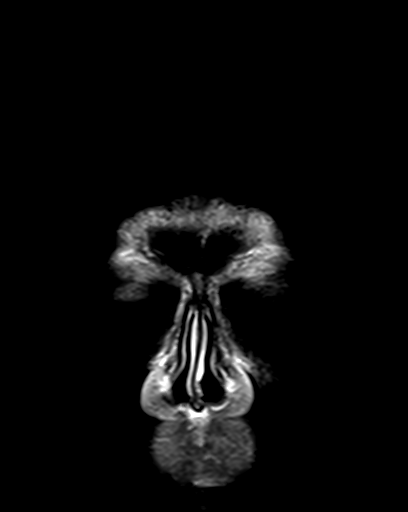
[im 32/32]
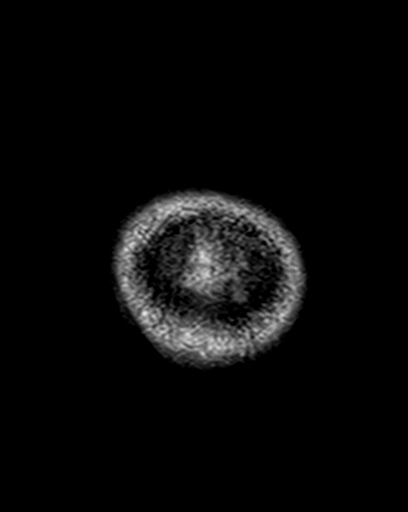

[Series 12: T1 post-contrast · axial · 1.0mm · 0.94mm/px · z∈[-37,+121]mm · 9 of 160 slices shown (1 of 3)]
[im 1/160]
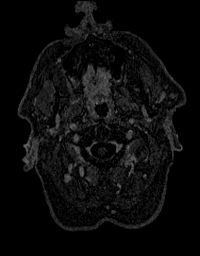
[im 20/160]
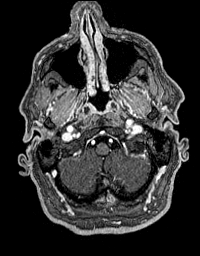
[im 40/160]
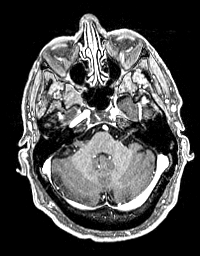
[im 60/160]
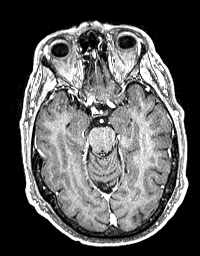
[im 80/160]
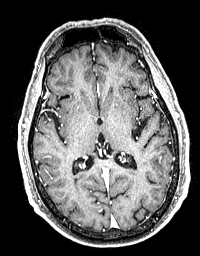
[im 100/160]
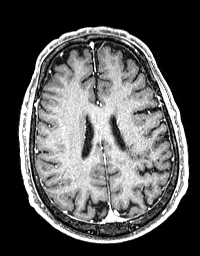
[im 120/160]
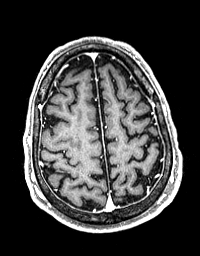
[im 140/160]
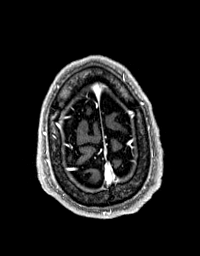
[im 160/160]
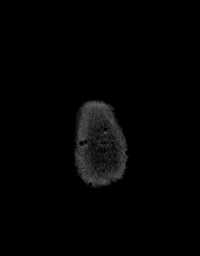

[Series 13: T1 post-contrast · coronal · 5.0mm · 0.43mm/px · 2 of 32 slices shown (2 of 3)]
[im 1/32]
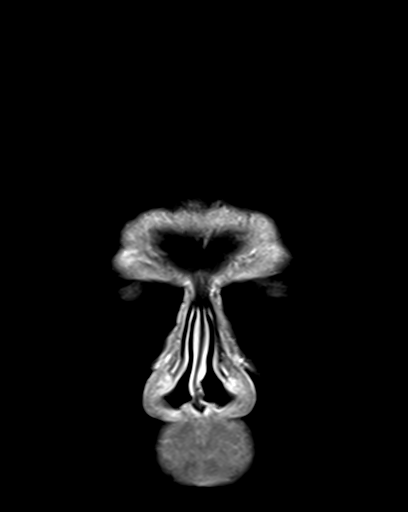
[im 32/32]
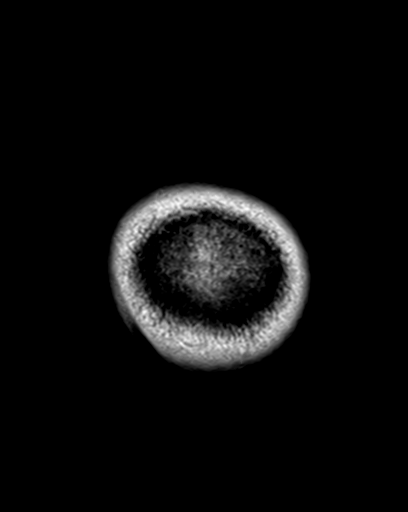

[Series 14: T1 post-contrast · sagittal · 5.0mm · 0.45mm/px · 1 of 23 slices shown (3 of 3)]
[im 1/23]
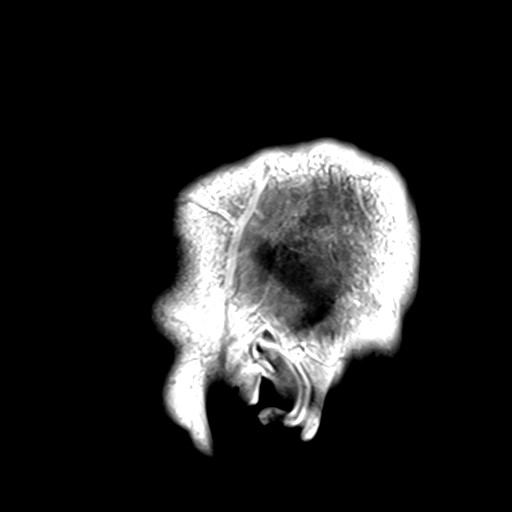

[48 of 48 positions shown; findings below may reference images not displayed]

FINDINGS: Brain: Diffusion hyperintense area in the right occipital white
matter is attributed to shine through based on ADC map. No worrisome
enhancement in this area or elsewhere.

Small remote left MCA branch infarct at the posterior frontal lobe.
No acute or interval infarct, hemorrhage, hydrocephalus, or
collection.

Vascular: Normal flow voids.  Normal vessel enhancements.

Skull and upper cervical spine: No focal lesion

Sinuses/Orbits: Bilateral cataract resection
IMPRESSION: The diffusion finding on prior is attributed to remote infarct. No
evidence of metastatic disease.

## 2020-12-18 MED ORDER — GADOBUTROL 1 MMOL/ML IV SOLN
8.0000 mL | Freq: Once | INTRAVENOUS | Status: AC | PRN
  Administered 2020-12-18: 10 mL via INTRAVENOUS

## 2020-12-19 DIAGNOSIS — Z87891 Personal history of nicotine dependence: Secondary | ICD-10-CM | POA: Diagnosis not present

## 2020-12-19 DIAGNOSIS — Z8551 Personal history of malignant neoplasm of bladder: Secondary | ICD-10-CM | POA: Diagnosis not present

## 2020-12-19 DIAGNOSIS — R531 Weakness: Secondary | ICD-10-CM | POA: Diagnosis not present

## 2020-12-19 DIAGNOSIS — Z7901 Long term (current) use of anticoagulants: Secondary | ICD-10-CM | POA: Diagnosis not present

## 2020-12-19 DIAGNOSIS — R42 Dizziness and giddiness: Secondary | ICD-10-CM | POA: Diagnosis not present

## 2020-12-19 DIAGNOSIS — N400 Enlarged prostate without lower urinary tract symptoms: Secondary | ICD-10-CM | POA: Diagnosis not present

## 2020-12-19 DIAGNOSIS — K589 Irritable bowel syndrome without diarrhea: Secondary | ICD-10-CM | POA: Diagnosis not present

## 2020-12-19 DIAGNOSIS — Z86718 Personal history of other venous thrombosis and embolism: Secondary | ICD-10-CM | POA: Diagnosis not present

## 2020-12-19 DIAGNOSIS — R519 Headache, unspecified: Secondary | ICD-10-CM | POA: Diagnosis not present

## 2020-12-19 DIAGNOSIS — Z79899 Other long term (current) drug therapy: Secondary | ICD-10-CM | POA: Diagnosis not present

## 2020-12-19 DIAGNOSIS — Z8673 Personal history of transient ischemic attack (TIA), and cerebral infarction without residual deficits: Secondary | ICD-10-CM | POA: Diagnosis not present

## 2020-12-19 DIAGNOSIS — Z881 Allergy status to other antibiotic agents status: Secondary | ICD-10-CM | POA: Diagnosis not present

## 2020-12-19 DIAGNOSIS — I499 Cardiac arrhythmia, unspecified: Secondary | ICD-10-CM | POA: Diagnosis not present

## 2020-12-20 ENCOUNTER — Encounter: Payer: Self-pay | Admitting: Family Medicine

## 2020-12-21 ENCOUNTER — Telehealth: Payer: Self-pay

## 2020-12-21 MED ORDER — MECLIZINE HCL 25 MG PO TABS
25.0000 mg | ORAL_TABLET | Freq: Three times a day (TID) | ORAL | 0 refills | Status: DC | PRN
Start: 2020-12-21 — End: 2021-01-09

## 2020-12-21 NOTE — Telephone Encounter (Signed)
Transition Care Management Follow-up Telephone Call Date of discharge and from where: 12/19/2020 from [facility] How have you been since you were released from the hospital? Pt states that he is still not feeling well. Pt started the flonase, claritin and meclizine that was rx'ed. Pt stated that he is still having the vertigo symptoms.  Any questions or concerns? No  Items Reviewed: Did the pt receive and understand the discharge instructions provided? Yes  Medications obtained and verified? Yes  Other? No  Any new allergies since your discharge? No  Dietary orders reviewed? No Do you have support at home? Yes   Functional Questionnaire: (I = Independent and D = Dependent) ADLs: I - difficult with the vertigo causing instability.   Bathing/Dressing- I  Meal Prep- I  Eating- I  Maintaining continence- I  Transferring/Ambulation- I  Managing Meds- I   Follow up appointments reviewed:  PCP Hospital f/u appt confirmed? Yes  Scheduled to see Luetta Nutting, DO on 12/19/2020 @ 03:10pm. [facility] f/u appt confirmed? Yes  [place] on 01/03/2021 with ENT Are transportation arrangements needed? No  If their condition worsens, is the pt aware to call PCP or go to the Emergency Dept.? Yes Was the patient provided with contact information for the PCP's office or ED? Yes Was to pt encouraged to call back with questions or concerns? Yes

## 2020-12-24 ENCOUNTER — Other Ambulatory Visit: Payer: Self-pay | Admitting: Family Medicine

## 2020-12-24 MED ORDER — CLONAZEPAM 0.5 MG PO TABS: 0.5000 mg | ORAL_TABLET | Freq: Two times a day (BID) | ORAL | 1 refills | Status: DC | PRN

## 2020-12-25 ENCOUNTER — Encounter: Payer: Self-pay | Admitting: Family Medicine

## 2021-01-03 DIAGNOSIS — H838X3 Other specified diseases of inner ear, bilateral: Secondary | ICD-10-CM | POA: Diagnosis not present

## 2021-01-03 DIAGNOSIS — H903 Sensorineural hearing loss, bilateral: Secondary | ICD-10-CM | POA: Diagnosis not present

## 2021-01-03 DIAGNOSIS — H6123 Impacted cerumen, bilateral: Secondary | ICD-10-CM | POA: Diagnosis not present

## 2021-01-03 DIAGNOSIS — R42 Dizziness and giddiness: Secondary | ICD-10-CM | POA: Diagnosis not present

## 2021-01-03 DIAGNOSIS — H905 Unspecified sensorineural hearing loss: Secondary | ICD-10-CM | POA: Diagnosis not present

## 2021-01-05 ENCOUNTER — Ambulatory Visit: Payer: Medicare Other | Admitting: Family Medicine

## 2021-01-09 ENCOUNTER — Ambulatory Visit (INDEPENDENT_AMBULATORY_CARE_PROVIDER_SITE_OTHER): Payer: Medicare Other | Admitting: Family Medicine

## 2021-01-09 ENCOUNTER — Other Ambulatory Visit: Payer: Self-pay

## 2021-01-09 ENCOUNTER — Encounter: Payer: Self-pay | Admitting: Family Medicine

## 2021-01-09 VITALS — BP 154/76 | HR 82 | Ht 70.0 in | Wt 173.0 lb

## 2021-01-09 DIAGNOSIS — K5909 Other constipation: Secondary | ICD-10-CM

## 2021-01-09 DIAGNOSIS — I951 Orthostatic hypotension: Secondary | ICD-10-CM | POA: Diagnosis not present

## 2021-01-09 DIAGNOSIS — R42 Dizziness and giddiness: Secondary | ICD-10-CM | POA: Insufficient documentation

## 2021-01-09 MED ORDER — LINACLOTIDE 145 MCG PO CAPS: 145.0000 ug | ORAL_CAPSULE | Freq: Every day | ORAL | 3 refills | Status: DC

## 2021-01-09 MED ORDER — MECLIZINE HCL 25 MG PO TABS: 25.0000 mg | ORAL_TABLET | Freq: Three times a day (TID) | ORAL | 3 refills | Status: DC | PRN

## 2021-01-09 MED ORDER — TOPIRAMATE 50 MG PO TABS: ORAL_TABLET | ORAL | 1 refills | Status: DC

## 2021-01-09 MED ORDER — ELIQUIS 5 MG PO TABS: 5.0000 mg | ORAL_TABLET | Freq: Two times a day (BID) | ORAL | 1 refills | Status: DC

## 2021-01-09 NOTE — Progress Notes (Signed)
Blake Rice - 70 y.o. male MRN [id number]  Date of birth: December 08, 1950  Subjective Chief Complaint  Patient presents with   Dizziness    HPI Blake Rice is a 70 year old male here today for follow-up of continued dizziness.  Reports continued dizziness and vertigo symptoms.  His symptoms vary from day-to-day.  He has been seen in the ED numerous times and had MRI x2 as well as MRV without any obvious cause of his symptoms.  He is also seeing neurology and ENT who have also been unable to provide an answer to why he continues to have dizziness.  He does report that he has had headache associated with this dizziness.  No prior history of migraines that he is aware of.  He has had problems with orthostasis in the past.  He is currently holding his amlodipine to see if this improves things.   Allergies  Allergen Reactions   Oxytetracycline Other (See Comments)    Unknown FOUND WITH ALLERGY SKIN TEST AT AGE OF 6    Past Medical History:  Diagnosis Date   BPH (benign prostatic hyperplasia) 04/08/2013   Chronic kidney disease, stage 2, mildly decreased GFR 06/30/2018   Dysautonomia ([facility]) 09/14/2018   Striational antibody 1:960    Dysautonomia ([facility])    Episodic lightheadedness    History of CVA (cerebrovascular accident) 12/25/2015   HTN (hypertension) 10/12/2013   Irritable bowel syndrome 11/27/2016   Mass of right parotid gland 12/03/2018   PET scan Q000111Q - Hypermetabolic right parotid nodule on image 37 measuring 12 mm with an SUV Max of 6.2. Image 37     PFO (patent foramen ovale) 12/25/2015   Rosacea     Past Surgical History:  Procedure Laterality Date   EYE SURGERY Bilateral 1999   cataract   TRANSURETHRAL RESECTION OF BLADDER TUMOR WITH GYRUS (TURBT-GYRUS)  12/30/2016   TRANSURETHRAL RESECTION OF PROSTATE  06/16/2017    Social History   Socioeconomic History   Marital status: Single    Spouse name: Not on file   Number of children: Not on file   Years of education: Not on file    Highest education level: Not on file  Occupational History   Not on file  Tobacco Use   Smoking status: Former    Packs/day: 1.00    Years: 27.00    Pack years: 27.00    Types: Cigarettes    Quit date: 08/10/2000    Years since quitting: 20.4   Smokeless tobacco: Never  Vaping Use   Vaping Use: Never used  Substance and Sexual Activity   Alcohol use: Not Currently   Drug use: Never   Sexual activity: Not Currently  Other Topics Concern   Not on file  Social History Narrative   Not on file   Social Determinants of Health   Financial Resource Strain: Not on file  Food Insecurity: Not on file  Transportation Needs: Not on file  Physical Activity: Not on file  Stress: Not on file  Social Connections: Not on file    Family History  Problem Relation Age of Onset   Hypertension Father     Health Maintenance  Topic Date Due   Hepatitis C Screening  Never done   TETANUS/TDAP  Never done   Zoster Vaccines- Shingrix (1 of 2) Never done   COLONOSCOPY (Pts 45-16yr Insurance coverage will need to be confirmed)  Never done   CMassacFOBT  12/19/2017   PNA vac Low Risk Adult (1  of 2 - PCV13) Never done   COVID-19 Vaccine (3 - Pfizer risk series) 08/27/2019   INFLUENZA VACCINE  12/04/2020   HPV VACCINES  Aged Out     ----------------------------------------------------------------------------------------------------------------------------------------------------------------------------------------------------------------- Physical Exam BP (!) 154/76 (BP Location: Left Arm, Patient Position: Sitting, Cuff Size: Normal)   Pulse 82   Ht '5\' 10"'$  (1.778 m)   Wt 173 lb (78.5 kg)   SpO2 99%   BMI 24.82 kg/m   Physical Exam Constitutional:      Appearance: Normal appearance.  Cardiovascular:     Rate and Rhythm: Normal rate and regular rhythm.  Pulmonary:     Effort: Pulmonary effort is normal.     Breath sounds: Normal breath sounds.   Neurological:     General: No focal deficit present.     Mental Status: He is alert and oriented to person, place, and time.     Comments: Unsteady gait noted.  Dizziness with standing    ------------------------------------------------------------------------------------------------------------------------------------------------------------------------------------------------------------------- Assessment and Plan  Orthostasis No significant orthostasis on exam today.  He will continue to hold amlodipine for now.  Dizziness Continues to have recurrent dizziness that seems to be vertiginous in nature.  Work-up to date has been negative.  He is having some headaches as well and I question whether this may be migraines causing his symptoms.  We discussed doing a trial of topiramate to see if this improves any of his symptoms.  We will start and titrate over the next few weeks.  Meclizine renewed.  Chronic constipation This remains well controlled with Linzess.  Continue current strength.   Meds ordered this encounter  Medications   topiramate (TOPAMAX) 50 MG tablet    Sig: Take 1/2 tab daily x1 week then increase to 1/2 tab BID x1 week then increase to 1 tab BID    Dispense:  60 tablet    Refill:  1   meclizine (ANTIVERT) 25 MG tablet    Sig: Take 1 tablet (25 mg total) by mouth 3 (three) times daily as needed for dizziness.    Dispense:  30 tablet    Refill:  3   linaclotide (LINZESS) 145 MCG CAPS capsule    Sig: Take 1 capsule (145 mcg total) by mouth daily before breakfast.    Dispense:  90 capsule    Refill:  3   ELIQUIS 5 MG TABS tablet    Sig: Take 1 tablet (5 mg total) by mouth 2 (two) times daily.    Dispense:  60 tablet    Refill:  1    No follow-ups on file.    This visit occurred during the SARS-CoV-2 public health emergency.  Safety protocols were in place, including screening questions prior to the visit, additional usage of staff PPE, and extensive cleaning  of exam room while observing appropriate contact time as indicated for disinfecting solutions.

## 2021-01-09 NOTE — Patient Instructions (Addendum)
Let's try topiramate.  Titrate per directions on bottle.  See me again in 2 months.   Orthostatic Hypotension Blood pressure is a measurement of how strongly, or weakly, your circulating blood is pressing against the walls of your arteries. Orthostatic hypotension is a drop in blood pressure that can happen when you change positions, such as when you go from lying down to standing. Arteries are blood vessels that carry blood from your heart throughout your body. When blood pressure is too low, you may not get enough blood to your brain or to the rest of your organs. Orthostatic hypotension can cause light-headedness, sweating, rapid heartbeat, blurred vision, and fainting. These symptoms require further investigation into the cause. What are the causes? Orthostatic hypotension can be caused by many things, including: Sudden changes in posture, such as standing up quickly after you have been sitting or lying down. Loss of blood (anemia) or loss of body fluids (dehydration). Heart problems, neurologic problems, or hormone problems. Pregnancy. Aging. The risk for this condition increases as you get older. Severe infection (sepsis). Certain medicines, such as medicines for high blood pressure or medicines that make the body lose excess fluids (diuretics). What are the signs or symptoms? Symptoms of this condition may include: Weakness, light-headedness, or dizziness. Sweating. Blurred vision. Tiredness (fatigue). Rapid heartbeat. Fainting, in severe cases. How is this diagnosed? This condition is diagnosed based on: Your symptoms and medical history. Your blood pressure measurements. Your health care provider will check your blood pressure when you are: Lying down. Sitting. Standing. A blood pressure reading is recorded as two numbers, such as "120 over 80" (or 120/80). The first ("top") number is called the systolic pressure. It is a measure of the pressure in your arteries as your heart  beats. The second ("bottom") number is called the diastolic pressure. It is a measure of the pressure in your arteries when your heart relaxes between beats. Blood pressure is measured in a unit called mmHg. Healthy blood pressure for most adults is 120/80 mmHg. Orthostatic hypotension is defined as a 20 mmHg drop in systolic pressure or a 10 mmHg drop in diastolic pressure within 3 minutes of standing. Other information or tests that may be used to diagnose orthostatic hypotension include: Your other vital signs, such as your heart rate and temperature. Blood tests. An electrocardiogram (ECG) or echocardiogram. A Holter monitor. This is a device you wear that records your heart rhythm continuously, usually for 24-48 hours. Tilt table test. For this test, you will be safely secured to a table that moves you from a lying position to an upright position. Your heart rhythm and blood pressure will be monitored during the test. How is this treated? This condition may be treated by: Changing your diet. This may involve eating more salt (sodium) or drinking more water. Changing the dosage of certain medicines you are taking that might be lowering your blood pressure. Correcting the underlying reason for the orthostatic hypotension. Wearing compression stockings. Taking medicines to raise your blood pressure. Avoiding actions that trigger symptoms. Follow these instructions at home: Medicines Take over-the-counter and prescription medicines only as told by your health care provider. Follow instructions from your health care provider about changing the dosage of your current medicines, if this applies. Do not stop or adjust any of your medicines on your own. Eating and drinking  Drink enough fluid to keep your urine pale yellow. Eat extra salt only as directed. Do not add extra salt to your diet unless advised  by your health care provider. Eat frequent, small meals. Avoid standing up suddenly after  eating. General instructions  Get up slowly from lying down or sitting positions. This gives your blood pressure a chance to adjust. Avoid hot showers and excessive heat as directed by your health care provider. Engage in regular physical activity as directed by your health care provider. If you have compression stockings, wear them as told. Keep all follow-up visits. This is important. Contact a health care provider if: You have a fever for more than 2-3 days. You feel more thirsty than usual. You feel dizzy or weak. Get help right away if: You have chest pain. You have a fast or irregular heartbeat. You become sweaty or feel light-headed. You feel short of breath. You faint. You have any symptoms of a stroke. "BE FAST" is an easy way to remember the main warning signs of a stroke: B - Balance. Signs are dizziness, sudden trouble walking, or loss of balance. E - Eyes. Signs are trouble seeing or a sudden change in vision. F - Face. Signs are sudden weakness or numbness of the face, or the face or eyelid drooping on one side. A - Arms. Signs are weakness or numbness in an arm. This happens suddenly and usually on one side of the body. S - Speech. Signs are sudden trouble speaking, slurred speech, or trouble understanding what people say. T - Time. Time to call emergency services. Write down what time symptoms started. You have other signs of a stroke, such as: A sudden, severe headache with no known cause. Nausea or vomiting. Seizure. These symptoms may represent a serious problem that is an emergency. Do not wait to see if the symptoms will go away. Get medical help right away. Call your local emergency services (911 in the [place] Do not drive yourself to the hospital. Summary Orthostatic hypotension is a sudden drop in blood pressure. It can cause light-headedness, sweating, rapid heartbeat, blurred vision, and fainting. Orthostatic hypotension can be diagnosed by having your blood  pressure taken while lying down, sitting, and then standing. Treatment may involve changing your diet, wearing compression stockings, sitting up slowly, adjusting your medicines, or correcting the underlying reason for the orthostatic hypotension. Get help right away if you have chest pain, a fast or irregular heartbeat, or symptoms of a stroke. This information is not intended to replace advice given to you by your health care provider. Make sure you discuss any questions you have with your health care provider. Document Revised: 07/06/2020 Document Reviewed: 07/06/2020 Elsevier Patient Education  [place].

## 2021-01-09 NOTE — Assessment & Plan Note (Signed)
No significant orthostasis on exam today.  He will continue to hold amlodipine for now.

## 2021-01-09 NOTE — Assessment & Plan Note (Signed)
This remains well controlled with Linzess.  Continue current strength.

## 2021-01-09 NOTE — Assessment & Plan Note (Signed)
Continues to have recurrent dizziness that seems to be vertiginous in nature.  Work-up to date has been negative.  He is having some headaches as well and I question whether this may be migraines causing his symptoms.  We discussed doing a trial of topiramate to see if this improves any of his symptoms.  We will start and titrate over the next few weeks.  Meclizine renewed.

## 2021-01-24 DIAGNOSIS — R42 Dizziness and giddiness: Secondary | ICD-10-CM | POA: Diagnosis not present

## 2021-01-24 DIAGNOSIS — Q211 Atrial septal defect: Secondary | ICD-10-CM | POA: Diagnosis not present

## 2021-01-24 DIAGNOSIS — Z8673 Personal history of transient ischemic attack (TIA), and cerebral infarction without residual deficits: Secondary | ICD-10-CM | POA: Diagnosis not present

## 2021-01-24 DIAGNOSIS — R519 Headache, unspecified: Secondary | ICD-10-CM | POA: Diagnosis not present

## 2021-02-11 ENCOUNTER — Encounter: Payer: Self-pay | Admitting: Family Medicine

## 2021-02-12 ENCOUNTER — Other Ambulatory Visit: Payer: Self-pay | Admitting: Family Medicine

## 2021-02-12 MED ORDER — MECLIZINE HCL 25 MG PO TABS: 25.0000 mg | ORAL_TABLET | Freq: Three times a day (TID) | ORAL | 6 refills | Status: DC | PRN

## 2021-03-27 DIAGNOSIS — Z23 Encounter for immunization: Secondary | ICD-10-CM | POA: Diagnosis not present

## 2021-04-03 DIAGNOSIS — Z961 Presence of intraocular lens: Secondary | ICD-10-CM | POA: Diagnosis not present

## 2021-04-03 DIAGNOSIS — H35371 Puckering of macula, right eye: Secondary | ICD-10-CM | POA: Diagnosis not present

## 2021-04-18 DIAGNOSIS — Z1212 Encounter for screening for malignant neoplasm of rectum: Secondary | ICD-10-CM | POA: Diagnosis not present

## 2021-04-18 DIAGNOSIS — Z1211 Encounter for screening for malignant neoplasm of colon: Secondary | ICD-10-CM | POA: Diagnosis not present

## 2021-04-19 DIAGNOSIS — H43812 Vitreous degeneration, left eye: Secondary | ICD-10-CM | POA: Diagnosis not present

## 2021-04-19 DIAGNOSIS — H35033 Hypertensive retinopathy, bilateral: Secondary | ICD-10-CM | POA: Diagnosis not present

## 2021-04-19 DIAGNOSIS — H35371 Puckering of macula, right eye: Secondary | ICD-10-CM | POA: Diagnosis not present

## 2021-04-25 DIAGNOSIS — C679 Malignant neoplasm of bladder, unspecified: Secondary | ICD-10-CM | POA: Diagnosis not present

## 2021-04-25 DIAGNOSIS — C61 Malignant neoplasm of prostate: Secondary | ICD-10-CM | POA: Diagnosis not present

## 2021-04-26 ENCOUNTER — Encounter: Payer: Self-pay | Admitting: Family Medicine

## 2021-04-26 DIAGNOSIS — Z8673 Personal history of transient ischemic attack (TIA), and cerebral infarction without residual deficits: Secondary | ICD-10-CM | POA: Diagnosis not present

## 2021-04-26 DIAGNOSIS — Q2112 Patent foramen ovale: Secondary | ICD-10-CM | POA: Diagnosis not present

## 2021-04-26 DIAGNOSIS — R519 Headache, unspecified: Secondary | ICD-10-CM | POA: Diagnosis not present

## 2021-04-26 DIAGNOSIS — R42 Dizziness and giddiness: Secondary | ICD-10-CM | POA: Diagnosis not present

## 2021-04-26 DIAGNOSIS — D84821 Immunodeficiency due to drugs: Secondary | ICD-10-CM | POA: Diagnosis not present

## 2021-04-26 DIAGNOSIS — Z79899 Other long term (current) drug therapy: Secondary | ICD-10-CM | POA: Insufficient documentation

## 2021-04-26 MED ORDER — ELIQUIS 5 MG PO TABS: 5.0000 mg | ORAL_TABLET | Freq: Two times a day (BID) | ORAL | 1 refills | Status: DC

## 2021-04-27 LAB — COLOGUARD
COLOGUARD: NEGATIVE
Cologuard: NEGATIVE

## 2021-05-04 ENCOUNTER — Encounter: Payer: Self-pay | Admitting: Family Medicine

## 2021-05-22 DIAGNOSIS — H35033 Hypertensive retinopathy, bilateral: Secondary | ICD-10-CM | POA: Diagnosis not present

## 2021-05-22 DIAGNOSIS — H35371 Puckering of macula, right eye: Secondary | ICD-10-CM | POA: Diagnosis not present

## 2021-05-22 DIAGNOSIS — H43813 Vitreous degeneration, bilateral: Secondary | ICD-10-CM | POA: Diagnosis not present

## 2021-05-31 DIAGNOSIS — C679 Malignant neoplasm of bladder, unspecified: Secondary | ICD-10-CM | POA: Diagnosis not present

## 2021-05-31 DIAGNOSIS — Z8673 Personal history of transient ischemic attack (TIA), and cerebral infarction without residual deficits: Secondary | ICD-10-CM | POA: Diagnosis not present

## 2021-05-31 DIAGNOSIS — Z8551 Personal history of malignant neoplasm of bladder: Secondary | ICD-10-CM | POA: Diagnosis not present

## 2021-05-31 DIAGNOSIS — Z8546 Personal history of malignant neoplasm of prostate: Secondary | ICD-10-CM | POA: Diagnosis not present

## 2021-05-31 DIAGNOSIS — Z86718 Personal history of other venous thrombosis and embolism: Secondary | ICD-10-CM | POA: Diagnosis not present

## 2021-05-31 DIAGNOSIS — Z86711 Personal history of pulmonary embolism: Secondary | ICD-10-CM | POA: Diagnosis not present

## 2021-05-31 DIAGNOSIS — Z9221 Personal history of antineoplastic chemotherapy: Secondary | ICD-10-CM | POA: Diagnosis not present

## 2021-05-31 DIAGNOSIS — I10 Essential (primary) hypertension: Secondary | ICD-10-CM | POA: Diagnosis not present

## 2021-05-31 DIAGNOSIS — R42 Dizziness and giddiness: Secondary | ICD-10-CM | POA: Diagnosis not present

## 2021-05-31 DIAGNOSIS — R519 Headache, unspecified: Secondary | ICD-10-CM | POA: Diagnosis not present

## 2021-05-31 DIAGNOSIS — R5383 Other fatigue: Secondary | ICD-10-CM | POA: Diagnosis not present

## 2021-05-31 DIAGNOSIS — J351 Hypertrophy of tonsils: Secondary | ICD-10-CM | POA: Diagnosis not present

## 2021-05-31 DIAGNOSIS — Z7901 Long term (current) use of anticoagulants: Secondary | ICD-10-CM | POA: Diagnosis not present

## 2021-05-31 DIAGNOSIS — R0683 Snoring: Secondary | ICD-10-CM | POA: Diagnosis not present

## 2021-05-31 DIAGNOSIS — R9089 Other abnormal findings on diagnostic imaging of central nervous system: Secondary | ICD-10-CM | POA: Diagnosis not present

## 2021-06-01 DIAGNOSIS — R519 Headache, unspecified: Secondary | ICD-10-CM | POA: Diagnosis not present

## 2021-06-01 DIAGNOSIS — R42 Dizziness and giddiness: Secondary | ICD-10-CM | POA: Diagnosis not present

## 2021-06-01 DIAGNOSIS — I491 Atrial premature depolarization: Secondary | ICD-10-CM | POA: Diagnosis not present

## 2021-06-01 DIAGNOSIS — C679 Malignant neoplasm of bladder, unspecified: Secondary | ICD-10-CM | POA: Diagnosis not present

## 2021-06-01 DIAGNOSIS — I10 Essential (primary) hypertension: Secondary | ICD-10-CM | POA: Diagnosis not present

## 2021-06-01 DIAGNOSIS — J351 Hypertrophy of tonsils: Secondary | ICD-10-CM | POA: Diagnosis not present

## 2021-06-01 DIAGNOSIS — Z8673 Personal history of transient ischemic attack (TIA), and cerebral infarction without residual deficits: Secondary | ICD-10-CM | POA: Diagnosis not present

## 2021-06-02 DIAGNOSIS — C679 Malignant neoplasm of bladder, unspecified: Secondary | ICD-10-CM | POA: Diagnosis not present

## 2021-06-02 DIAGNOSIS — I491 Atrial premature depolarization: Secondary | ICD-10-CM | POA: Diagnosis not present

## 2021-06-05 ENCOUNTER — Ambulatory Visit (INDEPENDENT_AMBULATORY_CARE_PROVIDER_SITE_OTHER): Payer: Medicare Other | Admitting: Family Medicine

## 2021-06-05 ENCOUNTER — Encounter: Payer: Self-pay | Admitting: Family Medicine

## 2021-06-05 ENCOUNTER — Other Ambulatory Visit: Payer: Self-pay

## 2021-06-05 DIAGNOSIS — I824Z2 Acute embolism and thrombosis of unspecified deep veins of left distal lower extremity: Secondary | ICD-10-CM | POA: Diagnosis not present

## 2021-06-05 DIAGNOSIS — I10 Essential (primary) hypertension: Secondary | ICD-10-CM | POA: Diagnosis not present

## 2021-06-05 DIAGNOSIS — K5909 Other constipation: Secondary | ICD-10-CM | POA: Diagnosis not present

## 2021-06-05 DIAGNOSIS — R519 Headache, unspecified: Secondary | ICD-10-CM | POA: Diagnosis not present

## 2021-06-05 MED ORDER — AMPICILLIN 500 MG PO CAPS: 500.0000 mg | ORAL_CAPSULE | Freq: Every day | ORAL | 1 refills | Status: AC

## 2021-06-05 MED ORDER — ELIQUIS 5 MG PO TABS: 5.0000 mg | ORAL_TABLET | Freq: Two times a day (BID) | ORAL | 3 refills | Status: DC

## 2021-06-05 MED ORDER — LINACLOTIDE 145 MCG PO CAPS: 145.0000 ug | ORAL_CAPSULE | Freq: Every day | ORAL | 3 refills | Status: DC

## 2021-06-05 NOTE — Assessment & Plan Note (Signed)
History of an unprovoked DVT and PE.  Remains on Eliquis for prophylaxis.  He will continue this for an additional few months and then we may consider cutting back to prophylactic dose at 2.5 mg twice daily.

## 2021-06-05 NOTE — Assessment & Plan Note (Signed)
He continues to do well with Linzess.  Continue at current strength.

## 2021-06-05 NOTE — Progress Notes (Signed)
Blake Rice - 71 y.o. male MRN [id number]  Date of birth: June 29, 1950  Subjective Chief Complaint  Patient presents with   Hypertension    HPI Blake Rice is a 71 year old male here today for follow-up visit.  He is seeing a new neurologist with qualification in headache specialty at [facility].  He was recently changed to verapamil by his neurologist to see if this may help with headache control.  Blood pressures have remained fairly stable.  These are slightly elevated in office today.  He is undergoing additional evaluation for his headaches including potential for low CSF headaches.  Constipation remains well controlled with Linzess.  He has remained on Eliquis for history of DVT/PE which was not provoked.  ROS:  A comprehensive ROS was completed and negative except as noted per HPI  Allergies  Allergen Reactions   Oxytetracycline Other (See Comments)    FOUND WITH ALLERGY SKIN TEST AT AGE OF 6    Past Medical History:  Diagnosis Date   BPH (benign prostatic hyperplasia) 04/08/2013   Chronic kidney disease, stage 2, mildly decreased GFR 06/30/2018   Dysautonomia ([place]) 09/14/2018   Striational antibody 1:960    Dysautonomia (HCC)    Episodic lightheadedness    History of CVA (cerebrovascular accident) 12/25/2015   HTN (hypertension) 10/12/2013   Irritable bowel syndrome 11/27/2016   Mass of right parotid gland 12/03/2018   PET scan 2/44/01 - Hypermetabolic right parotid nodule on image 37 measuring 12 mm with an SUV Max of 6.2. Image 37     PFO (patent foramen ovale) 12/25/2015   Rosacea     Past Surgical History:  Procedure Laterality Date   EYE SURGERY Bilateral 1999   cataract   TRANSURETHRAL RESECTION OF BLADDER TUMOR WITH GYRUS (TURBT-GYRUS)  12/30/2016   TRANSURETHRAL RESECTION OF PROSTATE  06/16/2017    Social History   Socioeconomic History   Marital status: Single    Spouse name: Not on file   Number of children: Not on file   Years of education: Not on file    Highest education level: Not on file  Occupational History   Not on file  Tobacco Use   Smoking status: Former    Packs/day: 1.00    Years: 27.00    Pack years: 27.00    Types: Cigarettes    Quit date: 08/10/2000    Years since quitting: 20.8   Smokeless tobacco: Never  Vaping Use   Vaping Use: Never used  Substance and Sexual Activity   Alcohol use: Not Currently   Drug use: Never   Sexual activity: Not Currently  Other Topics Concern   Not on file  Social History Narrative   Not on file   Social Determinants of Health   Financial Resource Strain: Not on file  Food Insecurity: Not on file  Transportation Needs: Not on file  Physical Activity: Not on file  Stress: Not on file  Social Connections: Not on file    Family History  Problem Relation Age of Onset   Hypertension Father     Health Maintenance  Topic Date Due   Hepatitis C Screening  Never done   Zoster Vaccines- Shingrix (1 of 2) 09/02/2021 (Originally 01/30/1972)   Pneumonia Vaccine 22+ Years old (1 - PCV) 06/05/2022 (Originally 01/30/1959)   TETANUS/TDAP  06/05/2022 (Originally 01/30/1972)   Fecal DNA (Cologuard)  04/27/2024   INFLUENZA VACCINE  Completed   COVID-19 Vaccine  Completed   HPV VACCINES  Aged Out     -----------------------------------------------------------------------------------------------------------------------------------------------------------------------------------------------------------------  Physical Exam There were no vitals taken for this visit.  Physical Exam Constitutional:      Appearance: Normal appearance.  Eyes:     General: No scleral icterus. Cardiovascular:     Rate and Rhythm: Normal rate and regular rhythm.  Musculoskeletal:     Cervical back: Neck supple.  Neurological:     Mental Status: He is alert.  Psychiatric:        Mood and Affect: Mood normal.        Behavior: Behavior normal.     ------------------------------------------------------------------------------------------------------------------------------------------------------------------------------------------------------------------- Assessment and Plan  HTN (hypertension) Blood pressure elevated in clinic today.  Readings at home have been better controlled, tolerating current dose of verapamil well.  Lower leg DVT (deep venous thromboembolism), acute, left (HCC) History of an unprovoked DVT and PE.  Remains on Eliquis for prophylaxis.  He will continue this for an additional few months and then we may consider cutting back to prophylactic dose at 2.5 mg twice daily.  Headache Continued headaches, seeing neurology at this time.  Chronic constipation He continues to do well with Linzess.  Continue at current strength.   Meds ordered this encounter  Medications   linaclotide (LINZESS) 145 MCG CAPS capsule    Sig: Take 1 capsule (145 mcg total) by mouth daily before breakfast.    Dispense:  90 capsule    Refill:  3   ELIQUIS 5 MG TABS tablet    Sig: Take 1 tablet (5 mg total) by mouth 2 (two) times daily.    Dispense:  60 tablet    Refill:  3   ampicillin (PRINCIPEN) 500 MG capsule    Sig: Take 1 capsule (500 mg total) by mouth daily.    Dispense:  90 capsule    Refill:  1    Return in about 6 months (around 12/03/2021) for HTN.    This visit occurred during the SARS-CoV-2 public health emergency.  Safety protocols were in place, including screening questions prior to the visit, additional usage of staff PPE, and extensive cleaning of exam room while observing appropriate contact time as indicated for disinfecting solutions.

## 2021-06-05 NOTE — Assessment & Plan Note (Signed)
Blood pressure elevated in clinic today.  Readings at home have been better controlled, tolerating current dose of verapamil well.

## 2021-06-05 NOTE — Assessment & Plan Note (Signed)
Continued headaches, seeing neurology at this time.

## 2021-06-25 DIAGNOSIS — Z8673 Personal history of transient ischemic attack (TIA), and cerebral infarction without residual deficits: Secondary | ICD-10-CM | POA: Diagnosis not present

## 2021-06-25 DIAGNOSIS — Z79899 Other long term (current) drug therapy: Secondary | ICD-10-CM | POA: Diagnosis not present

## 2021-06-25 DIAGNOSIS — H35371 Puckering of macula, right eye: Secondary | ICD-10-CM | POA: Diagnosis not present

## 2021-06-25 DIAGNOSIS — Z888 Allergy status to other drugs, medicaments and biological substances status: Secondary | ICD-10-CM | POA: Diagnosis not present

## 2021-06-25 DIAGNOSIS — F419 Anxiety disorder, unspecified: Secondary | ICD-10-CM | POA: Diagnosis not present

## 2021-06-25 DIAGNOSIS — Z7901 Long term (current) use of anticoagulants: Secondary | ICD-10-CM | POA: Diagnosis not present

## 2021-06-25 DIAGNOSIS — Q2112 Patent foramen ovale: Secondary | ICD-10-CM | POA: Diagnosis not present

## 2021-06-25 DIAGNOSIS — H33321 Round hole, right eye: Secondary | ICD-10-CM | POA: Diagnosis not present

## 2021-06-25 DIAGNOSIS — I10 Essential (primary) hypertension: Secondary | ICD-10-CM | POA: Diagnosis not present

## 2021-07-03 DIAGNOSIS — H35371 Puckering of macula, right eye: Secondary | ICD-10-CM | POA: Diagnosis not present

## 2021-07-04 ENCOUNTER — Encounter: Payer: Self-pay | Admitting: Family Medicine

## 2021-07-09 DIAGNOSIS — M5136 Other intervertebral disc degeneration, lumbar region: Secondary | ICD-10-CM | POA: Diagnosis not present

## 2021-07-09 DIAGNOSIS — C679 Malignant neoplasm of bladder, unspecified: Secondary | ICD-10-CM | POA: Diagnosis not present

## 2021-07-09 DIAGNOSIS — M5126 Other intervertebral disc displacement, lumbar region: Secondary | ICD-10-CM | POA: Diagnosis not present

## 2021-07-09 DIAGNOSIS — M4807 Spinal stenosis, lumbosacral region: Secondary | ICD-10-CM | POA: Diagnosis not present

## 2021-07-09 DIAGNOSIS — R42 Dizziness and giddiness: Secondary | ICD-10-CM | POA: Diagnosis not present

## 2021-07-09 DIAGNOSIS — M5127 Other intervertebral disc displacement, lumbosacral region: Secondary | ICD-10-CM | POA: Diagnosis not present

## 2021-07-09 DIAGNOSIS — M47816 Spondylosis without myelopathy or radiculopathy, lumbar region: Secondary | ICD-10-CM | POA: Diagnosis not present

## 2021-07-09 DIAGNOSIS — I10 Essential (primary) hypertension: Secondary | ICD-10-CM | POA: Diagnosis not present

## 2021-07-09 DIAGNOSIS — J351 Hypertrophy of tonsils: Secondary | ICD-10-CM | POA: Diagnosis not present

## 2021-07-09 DIAGNOSIS — Z8673 Personal history of transient ischemic attack (TIA), and cerebral infarction without residual deficits: Secondary | ICD-10-CM | POA: Diagnosis not present

## 2021-07-09 DIAGNOSIS — R519 Headache, unspecified: Secondary | ICD-10-CM | POA: Diagnosis not present

## 2021-07-09 DIAGNOSIS — Z0389 Encounter for observation for other suspected diseases and conditions ruled out: Secondary | ICD-10-CM | POA: Diagnosis not present

## 2021-07-11 DIAGNOSIS — R42 Dizziness and giddiness: Secondary | ICD-10-CM | POA: Diagnosis not present

## 2021-07-11 DIAGNOSIS — C679 Malignant neoplasm of bladder, unspecified: Secondary | ICD-10-CM | POA: Diagnosis not present

## 2021-07-11 DIAGNOSIS — I10 Essential (primary) hypertension: Secondary | ICD-10-CM | POA: Diagnosis not present

## 2021-07-11 DIAGNOSIS — Z8673 Personal history of transient ischemic attack (TIA), and cerebral infarction without residual deficits: Secondary | ICD-10-CM | POA: Diagnosis not present

## 2021-07-11 DIAGNOSIS — R519 Headache, unspecified: Secondary | ICD-10-CM | POA: Diagnosis not present

## 2021-07-11 DIAGNOSIS — M2578 Osteophyte, vertebrae: Secondary | ICD-10-CM | POA: Diagnosis not present

## 2021-07-11 DIAGNOSIS — J351 Hypertrophy of tonsils: Secondary | ICD-10-CM | POA: Diagnosis not present

## 2021-07-11 DIAGNOSIS — I6782 Cerebral ischemia: Secondary | ICD-10-CM | POA: Diagnosis not present

## 2021-08-03 DIAGNOSIS — H35371 Puckering of macula, right eye: Secondary | ICD-10-CM | POA: Diagnosis not present

## 2021-08-03 DIAGNOSIS — H43812 Vitreous degeneration, left eye: Secondary | ICD-10-CM | POA: Diagnosis not present

## 2021-08-23 DIAGNOSIS — R0683 Snoring: Secondary | ICD-10-CM | POA: Diagnosis not present

## 2021-08-23 DIAGNOSIS — E559 Vitamin D deficiency, unspecified: Secondary | ICD-10-CM | POA: Diagnosis not present

## 2021-08-23 DIAGNOSIS — Z8551 Personal history of malignant neoplasm of bladder: Secondary | ICD-10-CM | POA: Diagnosis not present

## 2021-08-23 DIAGNOSIS — Z79899 Other long term (current) drug therapy: Secondary | ICD-10-CM | POA: Diagnosis not present

## 2021-08-23 DIAGNOSIS — K5900 Constipation, unspecified: Secondary | ICD-10-CM | POA: Diagnosis not present

## 2021-08-23 DIAGNOSIS — Z8546 Personal history of malignant neoplasm of prostate: Secondary | ICD-10-CM | POA: Diagnosis not present

## 2021-08-23 DIAGNOSIS — G9529 Other cord compression: Secondary | ICD-10-CM | POA: Diagnosis not present

## 2021-08-23 DIAGNOSIS — K118 Other diseases of salivary glands: Secondary | ICD-10-CM | POA: Diagnosis not present

## 2021-08-23 DIAGNOSIS — Z9889 Other specified postprocedural states: Secondary | ICD-10-CM | POA: Diagnosis not present

## 2021-08-23 DIAGNOSIS — R42 Dizziness and giddiness: Secondary | ICD-10-CM | POA: Diagnosis not present

## 2021-08-23 DIAGNOSIS — J351 Hypertrophy of tonsils: Secondary | ICD-10-CM | POA: Diagnosis not present

## 2021-08-23 DIAGNOSIS — R5383 Other fatigue: Secondary | ICD-10-CM | POA: Diagnosis not present

## 2021-08-23 DIAGNOSIS — Z86718 Personal history of other venous thrombosis and embolism: Secondary | ICD-10-CM | POA: Diagnosis not present

## 2021-08-23 DIAGNOSIS — R519 Headache, unspecified: Secondary | ICD-10-CM | POA: Diagnosis not present

## 2021-08-23 DIAGNOSIS — Z9221 Personal history of antineoplastic chemotherapy: Secondary | ICD-10-CM | POA: Diagnosis not present

## 2021-08-23 DIAGNOSIS — Z8673 Personal history of transient ischemic attack (TIA), and cerebral infarction without residual deficits: Secondary | ICD-10-CM | POA: Diagnosis not present

## 2021-08-23 DIAGNOSIS — M9971 Connective tissue and disc stenosis of intervertebral foramina of cervical region: Secondary | ICD-10-CM | POA: Diagnosis not present

## 2021-10-10 DIAGNOSIS — Z23 Encounter for immunization: Secondary | ICD-10-CM | POA: Diagnosis not present

## 2021-10-16 ENCOUNTER — Other Ambulatory Visit: Payer: Self-pay | Admitting: Family Medicine

## 2021-10-22 DIAGNOSIS — C679 Malignant neoplasm of bladder, unspecified: Secondary | ICD-10-CM | POA: Diagnosis not present

## 2021-10-22 DIAGNOSIS — C61 Malignant neoplasm of prostate: Secondary | ICD-10-CM | POA: Diagnosis not present

## 2021-10-23 DIAGNOSIS — H31091 Other chorioretinal scars, right eye: Secondary | ICD-10-CM | POA: Diagnosis not present

## 2021-10-23 DIAGNOSIS — H35033 Hypertensive retinopathy, bilateral: Secondary | ICD-10-CM | POA: Diagnosis not present

## 2021-10-23 DIAGNOSIS — H43812 Vitreous degeneration, left eye: Secondary | ICD-10-CM | POA: Diagnosis not present

## 2021-10-24 DIAGNOSIS — J351 Hypertrophy of tonsils: Secondary | ICD-10-CM | POA: Diagnosis not present

## 2021-10-24 DIAGNOSIS — R42 Dizziness and giddiness: Secondary | ICD-10-CM | POA: Diagnosis not present

## 2021-10-24 DIAGNOSIS — E669 Obesity, unspecified: Secondary | ICD-10-CM | POA: Diagnosis not present

## 2021-10-24 DIAGNOSIS — Z8673 Personal history of transient ischemic attack (TIA), and cerebral infarction without residual deficits: Secondary | ICD-10-CM | POA: Diagnosis not present

## 2021-10-24 DIAGNOSIS — I10 Essential (primary) hypertension: Secondary | ICD-10-CM | POA: Diagnosis not present

## 2021-10-24 DIAGNOSIS — R519 Headache, unspecified: Secondary | ICD-10-CM | POA: Diagnosis not present

## 2021-10-24 DIAGNOSIS — Z6830 Body mass index (BMI) 30.0-30.9, adult: Secondary | ICD-10-CM | POA: Diagnosis not present

## 2021-10-24 DIAGNOSIS — C679 Malignant neoplasm of bladder, unspecified: Secondary | ICD-10-CM | POA: Diagnosis not present

## 2021-10-24 DIAGNOSIS — R0683 Snoring: Secondary | ICD-10-CM | POA: Diagnosis not present

## 2021-10-28 ENCOUNTER — Other Ambulatory Visit: Payer: Self-pay | Admitting: Family Medicine

## 2021-11-07 DIAGNOSIS — R8289 Other abnormal findings on cytological and histological examination of urine: Secondary | ICD-10-CM | POA: Diagnosis not present

## 2021-11-19 DIAGNOSIS — N2889 Other specified disorders of kidney and ureter: Secondary | ICD-10-CM | POA: Diagnosis not present

## 2021-11-19 DIAGNOSIS — C679 Malignant neoplasm of bladder, unspecified: Secondary | ICD-10-CM | POA: Diagnosis not present

## 2021-11-19 DIAGNOSIS — K5790 Diverticulosis of intestine, part unspecified, without perforation or abscess without bleeding: Secondary | ICD-10-CM | POA: Diagnosis not present

## 2021-11-19 DIAGNOSIS — N210 Calculus in bladder: Secondary | ICD-10-CM | POA: Diagnosis not present

## 2021-11-19 DIAGNOSIS — Z8551 Personal history of malignant neoplasm of bladder: Secondary | ICD-10-CM | POA: Diagnosis not present

## 2021-11-25 DIAGNOSIS — G4733 Obstructive sleep apnea (adult) (pediatric): Secondary | ICD-10-CM | POA: Diagnosis not present

## 2021-11-28 DIAGNOSIS — Z466 Encounter for fitting and adjustment of urinary device: Secondary | ICD-10-CM | POA: Diagnosis not present

## 2021-11-28 DIAGNOSIS — Z86711 Personal history of pulmonary embolism: Secondary | ICD-10-CM | POA: Diagnosis not present

## 2021-11-28 DIAGNOSIS — Z79899 Other long term (current) drug therapy: Secondary | ICD-10-CM | POA: Diagnosis not present

## 2021-11-28 DIAGNOSIS — Z8546 Personal history of malignant neoplasm of prostate: Secondary | ICD-10-CM | POA: Diagnosis not present

## 2021-11-28 DIAGNOSIS — N201 Calculus of ureter: Secondary | ICD-10-CM | POA: Diagnosis not present

## 2021-11-28 DIAGNOSIS — N4289 Other specified disorders of prostate: Secondary | ICD-10-CM | POA: Diagnosis not present

## 2021-11-28 DIAGNOSIS — N2889 Other specified disorders of kidney and ureter: Secondary | ICD-10-CM | POA: Diagnosis not present

## 2021-11-28 DIAGNOSIS — F419 Anxiety disorder, unspecified: Secondary | ICD-10-CM | POA: Diagnosis not present

## 2021-11-28 DIAGNOSIS — Z8551 Personal history of malignant neoplasm of bladder: Secondary | ICD-10-CM | POA: Diagnosis not present

## 2021-11-28 DIAGNOSIS — Z881 Allergy status to other antibiotic agents status: Secondary | ICD-10-CM | POA: Diagnosis not present

## 2021-11-28 DIAGNOSIS — C7989 Secondary malignant neoplasm of other specified sites: Secondary | ICD-10-CM | POA: Diagnosis not present

## 2021-11-28 DIAGNOSIS — Z8673 Personal history of transient ischemic attack (TIA), and cerebral infarction without residual deficits: Secondary | ICD-10-CM | POA: Diagnosis not present

## 2021-11-28 DIAGNOSIS — C679 Malignant neoplasm of bladder, unspecified: Secondary | ICD-10-CM | POA: Diagnosis not present

## 2021-11-28 DIAGNOSIS — Z87891 Personal history of nicotine dependence: Secondary | ICD-10-CM | POA: Diagnosis not present

## 2021-11-28 DIAGNOSIS — K589 Irritable bowel syndrome without diarrhea: Secondary | ICD-10-CM | POA: Diagnosis not present

## 2021-11-28 DIAGNOSIS — I10 Essential (primary) hypertension: Secondary | ICD-10-CM | POA: Diagnosis not present

## 2021-11-28 DIAGNOSIS — R8289 Other abnormal findings on cytological and histological examination of urine: Secondary | ICD-10-CM | POA: Diagnosis not present

## 2021-11-29 DIAGNOSIS — C679 Malignant neoplasm of bladder, unspecified: Secondary | ICD-10-CM | POA: Diagnosis not present

## 2021-11-29 DIAGNOSIS — N401 Enlarged prostate with lower urinary tract symptoms: Secondary | ICD-10-CM | POA: Diagnosis not present

## 2021-11-30 DIAGNOSIS — G8918 Other acute postprocedural pain: Secondary | ICD-10-CM | POA: Diagnosis not present

## 2021-11-30 DIAGNOSIS — N401 Enlarged prostate with lower urinary tract symptoms: Secondary | ICD-10-CM | POA: Diagnosis not present

## 2021-12-04 ENCOUNTER — Encounter: Payer: Self-pay | Admitting: Family Medicine

## 2021-12-04 ENCOUNTER — Ambulatory Visit (INDEPENDENT_AMBULATORY_CARE_PROVIDER_SITE_OTHER): Payer: Medicare Other | Admitting: Family Medicine

## 2021-12-04 DIAGNOSIS — Z87898 Personal history of other specified conditions: Secondary | ICD-10-CM | POA: Diagnosis not present

## 2021-12-04 DIAGNOSIS — C678 Malignant neoplasm of overlapping sites of bladder: Secondary | ICD-10-CM

## 2021-12-04 DIAGNOSIS — I10 Essential (primary) hypertension: Secondary | ICD-10-CM

## 2021-12-04 DIAGNOSIS — K5909 Other constipation: Secondary | ICD-10-CM | POA: Diagnosis not present

## 2021-12-04 DIAGNOSIS — I2699 Other pulmonary embolism without acute cor pulmonale: Secondary | ICD-10-CM

## 2021-12-04 MED ORDER — LINACLOTIDE 145 MCG PO CAPS
145.0000 ug | ORAL_CAPSULE | Freq: Every day | ORAL | 3 refills | Status: DC
Start: 2021-12-04 — End: 2022-06-06

## 2021-12-04 MED ORDER — APIXABAN 2.5 MG PO TABS: 2.5000 mg | ORAL_TABLET | Freq: Two times a day (BID) | ORAL | 6 refills | Status: DC

## 2021-12-04 MED ORDER — LINACLOTIDE 145 MCG PO CAPS: 145.0000 ug | ORAL_CAPSULE | Freq: Every day | ORAL | 0 refills | Status: DC

## 2021-12-04 MED ORDER — VERAPAMIL HCL 80 MG PO TABS: 80.0000 mg | ORAL_TABLET | Freq: Three times a day (TID) | ORAL | 3 refills | Status: DC

## 2021-12-04 MED ORDER — ELIQUIS 5 MG PO TABS
5.0000 mg | ORAL_TABLET | Freq: Two times a day (BID) | ORAL | 3 refills | Status: DC
Start: 2021-12-04 — End: 2021-12-04

## 2021-12-04 NOTE — Progress Notes (Signed)
Blake Rice - 71 y.o. male MRN [id number]  Date of birth: 1950-06-20  Subjective Chief Complaint  Patient presents with   Post-op Problem    HPI Blake Rice is a 71 year old male here today for follow-up visit.  Recently had cystoscopy with bladder biopsies.  Also had ureteral stent placed for ureteral calculus.  He has had some bleeding this has improved since procedure.  Denies pain with urination.  He does have some mild pain but is able to manage this.  The dizziness and headaches he was experiencing did resolve for several months however recently returned.  He does continue on meclizine.  Has seen neurology previously.  Tried on migraine medications which did not provide any relief.  Blood pressure is well controlled at this time.  Denies chest pain or shortness of breath.  Continues on Eliquis for unprovoked PE.  Tolerating this well.  ROS:  A comprehensive ROS was completed and negative except as noted per HPI  Allergies  Allergen Reactions   Oxytetracycline Other (See Comments)    FOUND WITH ALLERGY SKIN TEST AT AGE OF 6    Past Medical History:  Diagnosis Date   BPH (benign prostatic hyperplasia) 04/08/2013   Chronic kidney disease, stage 2, mildly decreased GFR 06/30/2018   Dysautonomia ([place]) 09/14/2018   Striational antibody 1:960    Dysautonomia (HCC)    Episodic lightheadedness    History of CVA (cerebrovascular accident) 12/25/2015   HTN (hypertension) 10/12/2013   Irritable bowel syndrome 11/27/2016   Mass of right parotid gland 12/03/2018   PET scan 05/11/24 - Hypermetabolic right parotid nodule on image 37 measuring 12 mm with an SUV Max of 6.2. Image 37     PFO (patent foramen ovale) 12/25/2015   Rosacea     Past Surgical History:  Procedure Laterality Date   EYE SURGERY Bilateral 1999   cataract   TRANSURETHRAL RESECTION OF BLADDER TUMOR WITH GYRUS (TURBT-GYRUS)  12/30/2016   TRANSURETHRAL RESECTION OF PROSTATE  06/16/2017    Social History   Socioeconomic  History   Marital status: Single    Spouse name: Not on file   Number of children: Not on file   Years of education: Not on file   Highest education level: Not on file  Occupational History   Not on file  Tobacco Use   Smoking status: Former    Packs/day: 1.00    Years: 27.00    Total pack years: 27.00    Types: Cigarettes    Quit date: 08/10/2000    Years since quitting: 21.3   Smokeless tobacco: Never  Vaping Use   Vaping Use: Never used  Substance and Sexual Activity   Alcohol use: Not Currently   Drug use: Never   Sexual activity: Not Currently  Other Topics Concern   Not on file  Social History Narrative   Not on file   Social Determinants of Health   Financial Resource Strain: Not on file  Food Insecurity: Not on file  Transportation Needs: Not on file  Physical Activity: Not on file  Stress: Not on file  Social Connections: Not on file    Family History  Problem Relation Age of Onset   Hypertension Father     Health Maintenance  Topic Date Due   INFLUENZA VACCINE  12/04/2021   Zoster Vaccines- Shingrix (1 of 2) 03/06/2022 (Originally 01/30/1972)   Pneumonia Vaccine 25+ Years old (1 - PCV) 06/05/2022 (Originally 01/29/2018)   TETANUS/TDAP  06/05/2022 (Originally 01/30/1972)   Hepatitis C  Screening  12/05/2022 (Originally 01/30/1971)   Fecal DNA (Cologuard)  04/27/2024   COVID-19 Vaccine  Completed   HPV VACCINES  Aged Out     ----------------------------------------------------------------------------------------------------------------------------------------------------------------------------------------------------------------- Physical Exam BP 122/74 (BP Location: Left Arm, Patient Position: Sitting, Cuff Size: Normal)   Pulse 83   Ht '5\' 10"'$  (1.778 m)   Wt 211 lb (95.7 kg)   SpO2 96%   BMI 30.28 kg/m   Physical Exam Constitutional:      Appearance: Normal appearance.  Eyes:     General: No scleral icterus. Pulmonary:     Effort: Pulmonary  effort is normal.     Breath sounds: Normal breath sounds.  Musculoskeletal:     Cervical back: Neck supple.  Neurological:     Mental Status: He is alert.  Psychiatric:        Mood and Affect: Mood normal.        Behavior: Behavior normal.     ------------------------------------------------------------------------------------------------------------------------------------------------------------------------------------------------------------------- Assessment and Plan  Left pulmonary embolus ([place]) History of unprovoked DVT and PE.  We will keep him on indefinite anticoagulation.  Reducing strength of Eliquis to 2.5 mg twice daily prophylactic dose.  I did provide him with a couple of vouchers to help with cost of this.  HTN (hypertension) Blood pressure remains well controlled at this time.  Recommend continuation of current medication at current strength.  Chronic constipation He has done well with Linzess.  Samples provided for him today.  Malignant neoplasm of overlapping sites of bladder [facility]) Recent biopsy is completed.  Followed by urology.  History of vertigo Has had recurrence of vertigo.  He will continue meclizine.   Meds ordered this encounter  Medications   linaclotide (LINZESS) 145 MCG CAPS capsule    Sig: Take 1 capsule (145 mcg total) by mouth daily before breakfast. Lot: [id number] Exp: 07/2022    Dispense:  12 capsule    Refill:  0   DISCONTD: ELIQUIS 5 MG TABS tablet    Sig: Take 1 tablet (5 mg total) by mouth 2 (two) times daily.    Dispense:  60 tablet    Refill:  3   linaclotide (LINZESS) 145 MCG CAPS capsule    Sig: Take 1 capsule (145 mcg total) by mouth daily before breakfast.    Dispense:  90 capsule    Refill:  3   verapamil (CALAN) 80 MG tablet    Sig: Take 1 tablet (80 mg total) by mouth 3 (three) times daily.    Dispense:  90 tablet    Refill:  3   apixaban (ELIQUIS) 2.5 MG TABS tablet    Sig: Take 1 tablet (2.5 mg total) by mouth 2 (two)  times daily.    Dispense:  60 tablet    Refill:  6    Replaces '5mg'$  strength.    Return in about 6 months (around 06/06/2022) for HTN/Labs.    This visit occurred during the SARS-CoV-2 public health emergency.  Safety protocols were in place, including screening questions prior to the visit, additional usage of staff PPE, and extensive cleaning of exam room while observing appropriate contact time as indicated for disinfecting solutions.

## 2021-12-04 NOTE — Assessment & Plan Note (Signed)
History of unprovoked DVT and PE.  We will keep him on indefinite anticoagulation.  Reducing strength of Eliquis to 2.5 mg twice daily prophylactic dose.  I did provide him with a couple of vouchers to help with cost of this.

## 2021-12-04 NOTE — Assessment & Plan Note (Signed)
Has had recurrence of vertigo.  He will continue meclizine.

## 2021-12-04 NOTE — Assessment & Plan Note (Signed)
Recent biopsy is completed.  Followed by urology.

## 2021-12-04 NOTE — Assessment & Plan Note (Signed)
Blood pressure remains well controlled at this time.  Recommend continuation of current medication at current strength.

## 2021-12-04 NOTE — Assessment & Plan Note (Signed)
He has done well with Linzess.  Samples provided for him today.

## 2021-12-11 DIAGNOSIS — C679 Malignant neoplasm of bladder, unspecified: Secondary | ICD-10-CM | POA: Diagnosis not present

## 2021-12-11 DIAGNOSIS — N200 Calculus of kidney: Secondary | ICD-10-CM | POA: Diagnosis not present

## 2021-12-11 DIAGNOSIS — C61 Malignant neoplasm of prostate: Secondary | ICD-10-CM | POA: Diagnosis not present

## 2021-12-17 DIAGNOSIS — G4733 Obstructive sleep apnea (adult) (pediatric): Secondary | ICD-10-CM | POA: Diagnosis not present

## 2021-12-23 DIAGNOSIS — Z9989 Dependence on other enabling machines and devices: Secondary | ICD-10-CM | POA: Diagnosis not present

## 2021-12-23 DIAGNOSIS — G4733 Obstructive sleep apnea (adult) (pediatric): Secondary | ICD-10-CM | POA: Diagnosis not present

## 2021-12-24 DIAGNOSIS — G4733 Obstructive sleep apnea (adult) (pediatric): Secondary | ICD-10-CM | POA: Diagnosis not present

## 2021-12-26 ENCOUNTER — Encounter: Payer: Self-pay | Admitting: General Practice

## 2021-12-31 DIAGNOSIS — N200 Calculus of kidney: Secondary | ICD-10-CM | POA: Diagnosis not present

## 2022-01-03 ENCOUNTER — Encounter: Payer: Self-pay | Admitting: *Deleted

## 2022-01-03 ENCOUNTER — Ambulatory Visit: Payer: Self-pay | Admitting: *Deleted

## 2022-01-03 NOTE — Patient Instructions (Signed)
Visit Information  Thank you for taking time to visit with me today. Please don't hesitate to contact me if I can be of assistance to you.   Following are the goals we discussed today:   Goals Addressed             This Visit's Progress    COMPLETED: Care Coordination Activities- no follow up required       Care Coordination Interventions: Evaluation of current treatment plan related to HTN, vertigo and patient's adherence to plan as established by provider Advised patient to continue occasionally monitoring blood pressures at home- reports general ranges between 120-130/70-80 Provided education to patient re: purpose and value of Medicare Annual Wellness Visit; explained that this visit may be done by phone or virtual or in person- patient declines scheduling this visit  Reviewed medications with patient and discussed use of meclizine for vertigo; confirmed patient has decreased dose of ACT as advised during PCP office visit 12/04/21; confirmed patient endorses adherence to taking medications as prescribed and verbalizes a very good understanding of his medications Reviewed scheduled/upcoming provider appointments including 06/06/21- PCP Advised patient to discuss ongoing intermittent but frequent episodes of vertigo  with provider Assessed social determinant of health barriers Reminded patient that flu season is approaching and confirmed that he plans to obtain flu vaccine accordingly Discussed recent sleep study results with patient: he confirms he has obtained and is now using new CPAP- has been using x last 2 nights, reports "going okay, better than" he thought it would           If you are experiencing a Mental Health or [place] or need someone to talk to, please  call the Suicide and Crisis Lifeline: 988 call the [place] National Suicide Prevention Lifeline: 6234823032 or TTY: 847-409-5597 TTY (313)726-0932) to talk to a trained counselor call 1-800-273-TALK  (toll free, 24 hour hotline) go to [facility] [address], [place] [phone]) call the [place]: [phone] call 911   Patient verbalizes understanding of instructions and care plan provided today and agrees to view in [place]. Active MyChart status and patient understanding of how to access instructions and care plan via MyChart confirmed with patient.     No further follow up required: no ongoing care coordination needs identified during outreach today  Oneta Rack, RN, BSN, CCRN Alumnus RN CM Care Coordination/ Transition of [place] Management [phone]: direct office

## 2022-01-03 NOTE — Patient Outreach (Signed)
  Care Coordination   Initial Visit Note   01/03/2022 Name: Blake Rice MRN: [id number] DOB: 1950/05/16  Blake Rice is a 71 y.o. year old male who sees Luetta Nutting, DO for primary care. I spoke with  Franz Dell by phone today.  What matters to the patients health and wellness today?  "Things are going overall well; I am still having periods of vertigo that come and go, but I am able to still get out and run three days a week and am able to drive; I just started using CPAP after the sleep study results showed that I have sleep apnea; I am hoping that using the CPAP might help with the vertigo, because I have tried everything else"  See interventions below- patient denies further/ ongoing care coordination needs   Goals Addressed             This Visit's Progress    COMPLETED: Care Coordination Activities- no follow up required       Care Coordination Interventions: Evaluation of current treatment plan related to HTN, vertigo and patient's adherence to plan as established by provider Advised patient to continue occasionally monitoring blood pressures at home- reports general ranges between 120-130/70-80 Provided education to patient re: purpose and value of Medicare Annual Wellness Visit; explained that this visit may be done by phone or virtual or in person- patient declines scheduling this visit  Reviewed medications with patient and discussed use of meclizine for vertigo; confirmed patient has decreased dose of ACT as advised during PCP office visit 12/04/21; confirmed patient endorses adherence to taking medications as prescribed and verbalizes a very good understanding of his medications Reviewed scheduled/upcoming provider appointments including 06/06/21- PCP Advised patient to discuss ongoing intermittent but frequent episodes of vertigo  with provider Assessed social determinant of health barriers Reminded patient that flu season is approaching and confirmed that he plans  to obtain flu vaccine accordingly Discussed recent sleep study results with patient: he confirms he has obtained and is now using new CPAP- has been using x last 2 nights, reports "going okay, better than" he thought it would           SDOH assessments and interventions completed:  Yes  SDOH Interventions Today    Flowsheet Row Most Recent Value  SDOH Interventions   Food Insecurity Interventions Intervention Not Indicated  Transportation Interventions Intervention Not Indicated  [drives self]       Care Coordination Interventions Activated:  Yes  Care Coordination Interventions:  Yes, provided   Follow up plan: No further intervention required.   Encounter Outcome:  Pt. Visit Completed   Oneta Rack, RN, BSN, CCRN Alumnus RN CM Care Coordination/ Transition of [place] Management [phone]: direct office

## 2022-02-13 DIAGNOSIS — G4733 Obstructive sleep apnea (adult) (pediatric): Secondary | ICD-10-CM | POA: Insufficient documentation

## 2022-02-14 DIAGNOSIS — I10 Essential (primary) hypertension: Secondary | ICD-10-CM | POA: Diagnosis not present

## 2022-02-14 DIAGNOSIS — G4733 Obstructive sleep apnea (adult) (pediatric): Secondary | ICD-10-CM | POA: Diagnosis not present

## 2022-02-20 DIAGNOSIS — Z8673 Personal history of transient ischemic attack (TIA), and cerebral infarction without residual deficits: Secondary | ICD-10-CM | POA: Diagnosis not present

## 2022-02-20 DIAGNOSIS — Z8546 Personal history of malignant neoplasm of prostate: Secondary | ICD-10-CM | POA: Diagnosis not present

## 2022-02-20 DIAGNOSIS — Z79899 Other long term (current) drug therapy: Secondary | ICD-10-CM | POA: Diagnosis not present

## 2022-02-20 DIAGNOSIS — R42 Dizziness and giddiness: Secondary | ICD-10-CM | POA: Diagnosis not present

## 2022-02-20 DIAGNOSIS — G4733 Obstructive sleep apnea (adult) (pediatric): Secondary | ICD-10-CM | POA: Diagnosis not present

## 2022-02-20 DIAGNOSIS — R519 Headache, unspecified: Secondary | ICD-10-CM | POA: Diagnosis not present

## 2022-02-20 DIAGNOSIS — Z8551 Personal history of malignant neoplasm of bladder: Secondary | ICD-10-CM | POA: Diagnosis not present

## 2022-02-20 DIAGNOSIS — Z08 Encounter for follow-up examination after completed treatment for malignant neoplasm: Secondary | ICD-10-CM | POA: Diagnosis not present

## 2022-02-20 DIAGNOSIS — Z86711 Personal history of pulmonary embolism: Secondary | ICD-10-CM | POA: Diagnosis not present

## 2022-02-20 DIAGNOSIS — M4802 Spinal stenosis, cervical region: Secondary | ICD-10-CM | POA: Diagnosis not present

## 2022-02-20 DIAGNOSIS — Z87891 Personal history of nicotine dependence: Secondary | ICD-10-CM | POA: Diagnosis not present

## 2022-03-12 DIAGNOSIS — C61 Malignant neoplasm of prostate: Secondary | ICD-10-CM | POA: Diagnosis not present

## 2022-03-12 DIAGNOSIS — C679 Malignant neoplasm of bladder, unspecified: Secondary | ICD-10-CM | POA: Diagnosis not present

## 2022-03-14 DIAGNOSIS — R8289 Other abnormal findings on cytological and histological examination of urine: Secondary | ICD-10-CM | POA: Diagnosis not present

## 2022-03-14 DIAGNOSIS — C679 Malignant neoplasm of bladder, unspecified: Secondary | ICD-10-CM | POA: Diagnosis not present

## 2022-03-19 DIAGNOSIS — R42 Dizziness and giddiness: Secondary | ICD-10-CM | POA: Diagnosis not present

## 2022-03-19 DIAGNOSIS — R519 Headache, unspecified: Secondary | ICD-10-CM | POA: Diagnosis not present

## 2022-03-29 DIAGNOSIS — R519 Headache, unspecified: Secondary | ICD-10-CM | POA: Diagnosis not present

## 2022-03-29 DIAGNOSIS — R42 Dizziness and giddiness: Secondary | ICD-10-CM | POA: Diagnosis not present

## 2022-04-06 DIAGNOSIS — Z23 Encounter for immunization: Secondary | ICD-10-CM | POA: Diagnosis not present

## 2022-04-22 DIAGNOSIS — R42 Dizziness and giddiness: Secondary | ICD-10-CM | POA: Diagnosis not present

## 2022-04-22 DIAGNOSIS — R519 Headache, unspecified: Secondary | ICD-10-CM | POA: Diagnosis not present

## 2022-04-24 ENCOUNTER — Ambulatory Visit: Payer: Medicare Other | Admitting: Family Medicine

## 2022-05-09 DIAGNOSIS — R42 Dizziness and giddiness: Secondary | ICD-10-CM | POA: Diagnosis not present

## 2022-05-09 DIAGNOSIS — R519 Headache, unspecified: Secondary | ICD-10-CM | POA: Diagnosis not present

## 2022-05-28 DIAGNOSIS — R42 Dizziness and giddiness: Secondary | ICD-10-CM | POA: Diagnosis not present

## 2022-05-28 DIAGNOSIS — R519 Headache, unspecified: Secondary | ICD-10-CM | POA: Diagnosis not present

## 2022-06-06 ENCOUNTER — Ambulatory Visit (INDEPENDENT_AMBULATORY_CARE_PROVIDER_SITE_OTHER): Payer: Medicare Other | Admitting: Family Medicine

## 2022-06-06 ENCOUNTER — Encounter: Payer: Self-pay | Admitting: Family Medicine

## 2022-06-06 VITALS — BP 149/77 | HR 88 | Ht 70.0 in | Wt 220.0 lb

## 2022-06-06 DIAGNOSIS — E559 Vitamin D deficiency, unspecified: Secondary | ICD-10-CM | POA: Diagnosis not present

## 2022-06-06 DIAGNOSIS — I2699 Other pulmonary embolism without acute cor pulmonale: Secondary | ICD-10-CM | POA: Diagnosis not present

## 2022-06-06 DIAGNOSIS — I10 Essential (primary) hypertension: Secondary | ICD-10-CM

## 2022-06-06 DIAGNOSIS — R42 Dizziness and giddiness: Secondary | ICD-10-CM | POA: Diagnosis not present

## 2022-06-06 MED ORDER — ELIQUIS 5 MG PO TABS: 5.0000 mg | ORAL_TABLET | Freq: Two times a day (BID) | ORAL | 3 refills | Status: DC

## 2022-06-06 NOTE — Progress Notes (Signed)
Blake Rice - 72 y.o. male MRN [id number]  Date of birth: 1950/08/19  Subjective Chief Complaint  Patient presents with   Follow-up    HPI Blake Rice is a 72 y.o. male here today for follow up visit.   He continues to have episodes of vertigo.  He has seen neurology and has not found a specific reason for his symptoms.  MRI of the brain normal.  He tried vestibular rehab without improvement.  Using meclizine prn which does provide some relief.  Denies auditory changes.  No palpitations or chest pain associated with episodes.   His is on verapamil for management of HTN. Tolerating well without significant side effects.    Remains on eliquis at maintenance dose for history of unprovoked PE.    ROS:  A comprehensive ROS was completed and negative except as noted per HPI  Allergies  Allergen Reactions   Oxytetracycline Other (See Comments)    FOUND WITH ALLERGY SKIN TEST AT AGE OF 6    Past Medical History:  Diagnosis Date   BPH (benign prostatic hyperplasia) 04/08/2013   Chronic kidney disease, stage 2, mildly decreased GFR 06/30/2018   Dysautonomia ([place]) 09/14/2018   Striational antibody 1:960    Dysautonomia ([facility])    Episodic lightheadedness    History of CVA (cerebrovascular accident) 12/25/2015   HTN (hypertension) 10/12/2013   Irritable bowel syndrome 11/27/2016   Mass of right parotid gland 12/03/2018   PET scan 6/81/27 - Hypermetabolic right parotid nodule on image 37 measuring 12 mm with an SUV Max of 6.2. Image 37     PFO (patent foramen ovale) 12/25/2015   Rosacea     Past Surgical History:  Procedure Laterality Date   EYE SURGERY Bilateral 1999   cataract   TRANSURETHRAL RESECTION OF BLADDER TUMOR WITH GYRUS (TURBT-GYRUS)  12/30/2016   TRANSURETHRAL RESECTION OF PROSTATE  06/16/2017    Social History   Socioeconomic History   Marital status: Single    Spouse name: Not on file   Number of children: Not on file   Years of education: Not on file   Highest  education level: Not on file  Occupational History   Not on file  Tobacco Use   Smoking status: Former    Packs/day: 1.00    Years: 27.00    Total pack years: 27.00    Types: Cigarettes    Quit date: 08/10/2000    Years since quitting: 21.8   Smokeless tobacco: Never  Vaping Use   Vaping Use: Never used  Substance and Sexual Activity   Alcohol use: Not Currently   Drug use: Never   Sexual activity: Not Currently  Other Topics Concern   Not on file  Social History Narrative   Not on file   Social Determinants of Health   Financial Resource Strain: Not on file  Food Insecurity: No Food Insecurity (01/03/2022)   Hunger Vital Sign    Worried About Running Out of Food in the Last Year: Never true    Ran Out of Food in the Last Year: Never true  Transportation Needs: Unknown (01/03/2022)   PRAPARE - Hydrologist (Medical): No    Lack of Transportation (Non-Medical): Not on file  Physical Activity: Not on file  Stress: Not on file  Social Connections: Not on file    Family History  Problem Relation Age of Onset   Hypertension Father     Health Maintenance  Topic Date Due   Medicare  Annual Wellness (AWV)  Never done   DTaP/Tdap/Td (1 - Tdap) Never done   INFLUENZA VACCINE  12/04/2021   COVID-19 Vaccine (7 - 2023-24 season) 01/04/2022   Zoster Vaccines- Shingrix (1 of 2) 09/04/2022 (Originally 01/30/1972)   Hepatitis C Screening  12/05/2022 (Originally 01/30/1971)   Pneumonia Vaccine 65+ Years old (1 - PCV) 06/07/2023 (Originally 01/29/2018)   Fecal DNA (Cologuard)  04/27/2024   HPV VACCINES  Aged Out     ----------------------------------------------------------------------------------------------------------------------------------------------------------------------------------------------------------------- Physical Exam BP (!) 149/77 (BP Location: Left Arm, Patient Position: Sitting, Cuff Size: Normal)   Pulse 88   Ht '5\' 10"'$  (1.778 m)    Wt 220 lb (99.8 kg)   SpO2 95%   BMI 31.57 kg/m   Physical Exam Constitutional:      Appearance: Normal appearance.  Eyes:     General: No scleral icterus. Cardiovascular:     Rate and Rhythm: Normal rate and regular rhythm.  Pulmonary:     Effort: Pulmonary effort is normal.     Breath sounds: Normal breath sounds.  Musculoskeletal:     Cervical back: Neck supple.  Neurological:     Mental Status: He is alert.  Psychiatric:        Mood and Affect: Mood normal.        Behavior: Behavior normal.     ------------------------------------------------------------------------------------------------------------------------------------------------------------------------------------------------------------------- Assessment and Plan  Left pulmonary embolus ([place]) He will remain on indefinite anticoagulation.  Continue Eliquis at 2.5 mg twice daily.  He would prefer this to be written as 5 mg tablets and cut these in half due to cost.  I did discuss with him that since these are filled coated he should only cut the pill in half the day of.    HTN (hypertension) His blood pressure is elevated today in clinic.  Reports normal readings at home.  He has had increased side effects with more medication.  Will continue verapamil at current strength.  Dizziness He continues to have periodic episodes of dizziness.  He has had extensive workup for this.  No improvement with vestibular rehab.  Using meclizine as needed.   Meds ordered this encounter  Medications   ELIQUIS 5 MG TABS tablet    Sig: Take 1 tablet (5 mg total) by mouth 2 (two) times daily.    Dispense:  60 tablet    Refill:  3    Return in about 6 months (around 12/05/2022) for HTN.    This visit occurred during the SARS-CoV-2 public health emergency.  Safety protocols were in place, including screening questions prior to the visit, additional usage of staff PPE, and extensive cleaning of exam room while observing appropriate  contact time as indicated for disinfecting solutions.

## 2022-06-09 NOTE — Assessment & Plan Note (Signed)
He will remain on indefinite anticoagulation.  Continue Eliquis at 2.5 mg twice daily.  He would prefer this to be written as 5 mg tablets and cut these in half due to cost.  I did discuss with him that since these are filled coated he should only cut the pill in half the day of.

## 2022-06-09 NOTE — Assessment & Plan Note (Signed)
His blood pressure is elevated today in clinic.  Reports normal readings at home.  He has had increased side effects with more medication.  Will continue verapamil at current strength.

## 2022-06-09 NOTE — Assessment & Plan Note (Addendum)
He continues to have periodic episodes of dizziness.  He has had extensive workup for this.  No improvement with vestibular rehab.  Using meclizine as needed.

## 2022-06-13 DIAGNOSIS — R42 Dizziness and giddiness: Secondary | ICD-10-CM | POA: Diagnosis not present

## 2022-06-13 DIAGNOSIS — Z79899 Other long term (current) drug therapy: Secondary | ICD-10-CM | POA: Diagnosis not present

## 2022-06-13 DIAGNOSIS — Z881 Allergy status to other antibiotic agents status: Secondary | ICD-10-CM | POA: Diagnosis not present

## 2022-06-13 DIAGNOSIS — G4733 Obstructive sleep apnea (adult) (pediatric): Secondary | ICD-10-CM | POA: Diagnosis not present

## 2022-06-13 DIAGNOSIS — R519 Headache, unspecified: Secondary | ICD-10-CM | POA: Diagnosis not present

## 2022-06-24 DIAGNOSIS — Z6830 Body mass index (BMI) 30.0-30.9, adult: Secondary | ICD-10-CM | POA: Diagnosis not present

## 2022-06-24 DIAGNOSIS — G4733 Obstructive sleep apnea (adult) (pediatric): Secondary | ICD-10-CM | POA: Diagnosis not present

## 2022-06-24 DIAGNOSIS — Z8673 Personal history of transient ischemic attack (TIA), and cerebral infarction without residual deficits: Secondary | ICD-10-CM | POA: Diagnosis not present

## 2022-06-24 DIAGNOSIS — E669 Obesity, unspecified: Secondary | ICD-10-CM | POA: Diagnosis not present

## 2022-07-05 ENCOUNTER — Ambulatory Visit (INDEPENDENT_AMBULATORY_CARE_PROVIDER_SITE_OTHER): Payer: Medicare Other | Admitting: Family Medicine

## 2022-07-05 DIAGNOSIS — Z0000 Encounter for general adult medical examination without abnormal findings: Secondary | ICD-10-CM | POA: Diagnosis not present

## 2022-07-05 NOTE — Patient Instructions (Signed)
[facility] Maintenance Summary and Written Plan of Care  [name] ,  Thank you for allowing me to perform your Medicare Annual Wellness Visit and for your ongoing commitment to your health.   Health Maintenance & Immunization History Health Maintenance  Topic Date Due   DTaP/Tdap/Td (1 - Tdap) Never done   COVID-19 Vaccine (8 - 2023-24 season) 07/21/2022 (Originally 05/31/2022)   Zoster Vaccines- Shingrix (1 of 2) 09/04/2022 (Originally 01/30/1972)   Hepatitis C Screening  12/05/2022 (Originally 01/30/1971)   Pneumonia Vaccine 59+ Years old (1 of 1 - PCV) 06/07/2023 (Originally 01/29/2018)   Medicare Annual Wellness (AWV)  07/05/2023   Fecal DNA (Cologuard)  04/27/2024   INFLUENZA VACCINE  Completed   HPV VACCINES  Aged Out   Immunization History  Administered Date(s) Administered   Fluad Quad(high Dose 65+) 03/27/2021   Influenza, High Dose Seasonal PF 04/05/2022   Influenza-Unspecified 04/17/2019   PFIZER(Purple Top)SARS-COV-2 Vaccination 07/09/2019, 07/30/2019, 03/16/2020, 09/14/2020, 03/27/2021   Pfizer Covid-19 Vaccine Bivalent Booster 47yr & up 04/05/2022   Pfizer Covid-19 Vaccine Bivalent Booster 5y-11y 03/27/2021, 10/10/2021    These are the patient goals that we discussed:  Goals Addressed               This Visit's Progress     Patient Stated (pt-stated)        Patient would like to work on his weight.         This is a list of Health Maintenance Items that are overdue or due now: Health Maintenance Due  Topic Date Due   DTaP/Tdap/Td (1 - Tdap) Never done   Pneumonia vaccine Shingles vaccine  Orders/Referrals Placed Today: No orders of the defined types were placed in this encounter.  (Contact our referral department at [phone] you have not spoken with someone about your referral appointment within the next 5 days)    Follow-up Plan Follow-up with MLuetta Nutting DO as planned Medicare wellness visit in one  year.  Patient will access AVS on my chart.      Health Maintenance, Male Adopting a healthy lifestyle and getting preventive care are important in promoting health and wellness. Ask your health care provider about: The right schedule for you to have regular tests and exams. Things you can do on your own to prevent diseases and keep yourself healthy. What should I know about diet, weight, and exercise? Eat a healthy diet  Eat a diet that includes plenty of vegetables, fruits, low-fat dairy products, and lean protein. Do not eat a lot of foods that are high in solid fats, added sugars, or sodium. Maintain a healthy weight Body mass index (BMI) is a measurement that can be used to identify possible weight problems. It estimates body fat based on height and weight. Your health care provider can help determine your BMI and help you achieve or maintain a healthy weight. Get regular exercise Get regular exercise. This is one of the most important things you can do for your health. Most adults should: Exercise for at least 150 minutes each week. The exercise should increase your heart rate and make you sweat (moderate-intensity exercise). Do strengthening exercises at least twice a week. This is in addition to the moderate-intensity exercise. Spend less time sitting. Even light physical activity can be beneficial. Watch cholesterol and blood lipids Have your blood tested for lipids and cholesterol at 70years of age, then have this test every 5 years. You may need to have your  cholesterol levels checked more often if: Your lipid or cholesterol levels are high. You are older than 70 years of age. You are at high risk for heart disease. What should I know about cancer screening? Many types of cancers can be detected early and may often be prevented. Depending on your health history and family history, you may need to have cancer screening at various ages. This may include screening  for: Colorectal cancer. Prostate cancer. Skin cancer. Lung cancer. What should I know about heart disease, diabetes, and high blood pressure? Blood pressure and heart disease High blood pressure causes heart disease and increases the risk of stroke. This is more likely to develop in people who have high blood pressure readings or are overweight. Talk with your health care provider about your target blood pressure readings. Have your blood pressure checked: Every 3-5 years if you are 32-70 years of age. Every year if you are 50 years old or older. If you are between the ages of 22 and 30 and are a current or former smoker, ask your health care provider if you should have a one-time screening for abdominal aortic aneurysm (AAA). Diabetes Have regular diabetes screenings. This checks your fasting blood sugar level. Have the screening done: Once every three years after age 61 if you are at a normal weight and have a low risk for diabetes. More often and at a younger age if you are overweight or have a high risk for diabetes. What should I know about preventing infection? Hepatitis B If you have a higher risk for hepatitis B, you should be screened for this virus. Talk with your health care provider to find out if you are at risk for hepatitis B infection. Hepatitis C Blood testing is recommended for: Everyone born from [birth year] through 1963. Anyone with known risk factors for hepatitis C. Sexually transmitted infections (STIs) You should be screened each year for STIs, including gonorrhea and chlamydia, if: You are sexually active and are younger than 70 years of age. You are older than 69 years of age and your health care provider tells you that you are at risk for this type of infection. Your sexual activity has changed since you were last screened, and you are at increased risk for chlamydia or gonorrhea. Ask your health care provider if you are at risk. Ask your health care provider about  whether you are at high risk for HIV. Your health care provider may recommend a prescription medicine to help prevent HIV infection. If you choose to take medicine to prevent HIV, you should first get tested for HIV. You should then be tested every 3 months for as long as you are taking the medicine. Follow these instructions at home: Alcohol use Do not drink alcohol if your health care provider tells you not to drink. If you drink alcohol: Limit how much you have to 0-2 drinks a day. Know how much alcohol is in your drink. In the [place] one drink equals one 12 oz bottle of beer (355 mL), one 5 oz glass of wine (148 mL), or one 1 oz glass of hard liquor (44 mL). Lifestyle Do not use any products that contain nicotine or tobacco. These products include cigarettes, chewing tobacco, and vaping devices, such as e-cigarettes. If you need help quitting, ask your health care provider. Do not use street drugs. Do not share needles. Ask your health care provider for help if you need support or information about quitting drugs. General instructions Schedule regular  health, dental, and eye exams. Stay current with your vaccines. Tell your health care provider if: You often feel depressed. You have ever been abused or do not feel safe at home. Summary Adopting a healthy lifestyle and getting preventive care are important in promoting health and wellness. Follow your health care provider's instructions about healthy diet, exercising, and getting tested or screened for diseases. Follow your health care provider's instructions on monitoring your cholesterol and blood pressure. This information is not intended to replace advice given to you by your health care provider. Make sure you discuss any questions you have with your health care provider. Document Revised: 09/11/2020 Document Reviewed: 09/11/2020 Elsevier Patient Education  [place].

## 2022-07-05 NOTE — Progress Notes (Signed)
MEDICARE ANNUAL WELLNESS VISIT  07/05/2022  Telephone Visit Disclaimer This Medicare AWV was conducted by telephone due to national recommendations for restrictions regarding the COVID-19 Pandemic (e.g. social distancing).  I verified, using two identifiers, that I am speaking with Blake Rice or their authorized healthcare agent. I discussed the limitations, risks, security, and privacy concerns of performing an evaluation and management service by telephone and the potential availability of an in-person appointment in the future. The patient expressed understanding and agreed to proceed.  Location of Patient: Home Location of Provider (nurse):  In the office.  Subjective:    Blake Rice is a 72 y.o. male patient of Luetta Nutting, DO who had a Medicare Annual Wellness Visit today via telephone. Blake Rice is Working part time and lives alone. he does not have any children. he reports that he is socially active and does interact with friends/family regularly. he is moderately physically active and enjoys travelling.  Patient Care Team: Luetta Nutting, DO as PCP - General (Family Medicine) Gwendel Hanson, MD as Consulting Physician (Urology) Darlin Priestly, MD as Consulting Physician (Neurology)     07/05/2022    3:15 PM 11/27/2016   10:55 AM  Advanced Directives  Does Patient Have a Medical Advance Directive? Yes Yes  Type of Advance Directive Living will Living will  Does patient want to make changes to medical advance directive? No - Patient declined No - Patient declined    Hospital Utilization Over the Past 12 Months: # of hospitalizations or ER visits: 0 # of surgeries: 1  Review of Systems    Patient reports that his overall health is better compared to last year.  History obtained from chart review and the patient  Patient Reported Readings (BP, Pulse, CBG, Weight, etc) none  Pain Assessment Pain : No/denies pain     Current Medications & Allergies  (verified) Allergies as of 07/05/2022       Reactions   Oxytetracycline Other (See Comments)   FOUND WITH ALLERGY SKIN TEST AT AGE OF 6        Medication List        Accurate as of July 05, 2022  3:35 PM. If you have any questions, ask your nurse or doctor.          ampicillin 500 MG capsule Commonly known as: PRINCIPEN TAKE ONE CAPSULE BY MOUTH FOUR TIMES A DAY AS NEEDED FOR ROSACEA What changed: See the new instructions.   Eliquis 2.5 MG Tabs tablet Generic drug: apixaban Take by mouth. What changed: Another medication with the same name was changed. Make sure you understand how and when to take each.   Eliquis 5 MG Tabs tablet Generic drug: apixaban Take 1 tablet (5 mg total) by mouth 2 (two) times daily. What changed: additional instructions   meclizine 25 MG tablet Commonly known as: ANTIVERT Take 1 tablet (25 mg total) by mouth 3 (three) times daily as needed for dizziness. What changed: when to take this   verapamil 80 MG tablet Commonly known as: CALAN Take 1 tablet (80 mg total) by mouth 3 (three) times daily. What changed: when to take this        History (reviewed): Past Medical History:  Diagnosis Date   Allergy 1959   Arthritis late 2023   BPH (benign prostatic hyperplasia) 04/08/2013   Cancer [facility]) June 2018   Cataract 1999   Chronic kidney disease, stage 2, mildly decreased GFR 06/30/2018   Dysautonomia ([place]) 09/14/2018   Striational  antibody 1:960    Dysautonomia (HCC)    Episodic lightheadedness    History of CVA (cerebrovascular accident) 12/25/2015   HTN (hypertension) 10/12/2013   Irritable bowel syndrome 11/27/2016   Mass of right parotid gland 12/03/2018   PET scan [id number] - Hypermetabolic right parotid nodule on image 37 measuring 12 mm with an SUV Max of 6.2. Image 37     PFO (patent foramen ovale) 12/25/2015   Rosacea    Sleep apnea 2023   Stroke ([place]) 08/10/2000   Past Surgical History:  Procedure Laterality Date   EYE  SURGERY Bilateral 1999   cataract   TRANSURETHRAL RESECTION OF BLADDER TUMOR WITH GYRUS (TURBT-GYRUS)  12/30/2016   TRANSURETHRAL RESECTION OF PROSTATE  06/16/2017   Family History  Problem Relation Age of Onset   Hypertension Father    Social History   Socioeconomic History   Marital status: Single    Spouse name: Not on file   Number of children: 0   Years of education: 16   Highest education level: Bachelor's degree (e.g., BA, AB, BS)  Occupational History   Occupation: part-time  Tobacco Use   Smoking status: Former    Packs/day: 1.00    Years: 27.00    Total pack years: 27.00    Types: Cigarettes    Quit date: 08/10/2000    Years since quitting: 21.9   Smokeless tobacco: Never  Vaping Use   Vaping Use: Never used  Substance and Sexual Activity   Alcohol use: Yes    Alcohol/week: 2.0 - 3.0 standard drinks of alcohol    Types: 2 - 3 Cans of beer per week   Drug use: Never   Sexual activity: Not Currently    Birth control/protection: Abstinence  Other Topics Concern   Not on file  Social History Narrative   Lives alone. He enjoys travelling.    Social Determinants of Health   Financial Resource Strain: Low Risk  (07/01/2022)   Overall Financial Resource Strain (CARDIA)    Difficulty of Paying Living Expenses: Not hard at all  Food Insecurity: No Food Insecurity (07/01/2022)   Hunger Vital Sign    Worried About Running Out of Food in the Last Year: Never true    Ran Out of Food in the Last Year: Never true  Transportation Needs: No Transportation Needs (07/05/2022)   PRAPARE - Hydrologist (Medical): No    Lack of Transportation (Non-Medical): No  Recent Concern: Transportation Needs - Unmet Transportation Needs (07/01/2022)   PRAPARE - Transportation    Lack of Transportation (Medical): Yes    Lack of Transportation (Non-Medical): Yes  Physical Activity: Sufficiently Active (07/01/2022)   Exercise Vital Sign    Days of Exercise per  Week: 6 days    Minutes of Exercise per Session: 60 min  Stress: No Stress Concern Present (07/01/2022)   [place]    Feeling of Stress : Not at all  Social Connections: Socially Isolated (07/05/2022)   Social Connection and Isolation Panel [NHANES]    Frequency of Communication with Friends and Family: Three times a week    Frequency of Social Gatherings with Friends and Family: Once a week    Attends Religious Services: Never    Marine scientist or Organizations: No    Attends Music therapist: Not on file    Marital Status: Never married    Activities of Daily Living  07/01/2022    9:54 PM  In your present state of health, do you have any difficulty performing the following activities:  Hearing? 0  Vision? 0  Difficulty concentrating or making decisions? 0  Walking or climbing stairs? 0  Dressing or bathing? 0  Doing errands, shopping? 0  Preparing Food and eating ? N  Using the Toilet? N  In the past six months, have you accidently leaked urine? N  Do you have problems with loss of bowel control? N  Managing your Medications? N  Managing your Finances? N  Housekeeping or managing your Housekeeping? N    Patient Education/ Literacy How often do you need to have someone help you when you read instructions, pamphlets, or other written materials from your doctor or pharmacy?: 1 - Never What is the last grade level you completed in school?: Bachelor's degree  Exercise Current Exercise Habits: Home exercise routine, Type of exercise: Other - see comments;strength training/weights (aerobics and running), Time (Minutes): 60, Frequency (Times/Week): 6, Weekly Exercise (Minutes/Week): 360, Intensity: Moderate, Exercise limited by: None identified  Diet Patient reports consuming 3 meals a day and 0 snack(s) a day Patient reports that his primary diet is: Regular Patient reports that she does  have regular access to food.   Depression Screen    07/05/2022    3:06 PM 06/06/2022    1:58 PM 06/05/2021    4:02 PM 08/22/2020    1:58 PM 04/20/2019   12:58 PM 06/26/2018    2:35 PM 02/03/2018    1:56 PM  PHQ 2/9 Scores  PHQ - 2 Score 0 0 0 0 0 0 0  PHQ- 9 Score     0       Fall Risk    07/05/2022    3:06 PM 07/01/2022    9:54 PM 06/06/2022    1:58 PM 06/05/2021    4:02 PM 08/22/2020    1:58 PM  [place] in the past year? 0 0 0 0 0  Number falls in past yr: 0 0 0 0 0  Injury with Fall? 0 0 0 0 0  Risk for fall due to : No Fall Risks  No Fall Risks No Fall Risks No Fall Risks  Follow up Falls evaluation completed  Falls evaluation completed Falls evaluation completed Falls evaluation completed     Objective:  Octavia Bronkema seemed alert and oriented and he participated appropriately during our telephone visit.  Blood Pressure Weight BMI  BP Readings from Last 3 Encounters:  06/06/22 (!) 149/77  12/04/21 122/74  01/09/21 (!) 154/76   Wt Readings from Last 3 Encounters:  06/06/22 220 lb (99.8 kg)  12/04/21 211 lb (95.7 kg)  01/09/21 173 lb (78.5 kg)   BMI Readings from Last 1 Encounters:  06/06/22 31.57 kg/m    *Unable to obtain current vital signs, weight, and BMI due to telephone visit type  Hearing/Vision  Atilano did not seem to have difficulty with hearing/understanding during the telephone conversation Reports that he has had a formal eye exam by an eye care professional within the past year Reports that he has had a formal hearing evaluation within the past year *Unable to fully assess hearing and vision during telephone visit type  Cognitive Function:    07/05/2022    3:26 PM  6CIT Screen  What Year? 0 points  What month? 0 points  What time? 0 points  Count back from 20 0 points  Months in reverse 0  points  Repeat phrase 0 points  Total Score 0 points   (Normal:0-7, Significant for Dysfunction: >8)  Normal Cognitive Function Screening:  Yes   Immunization & Health Maintenance Record Immunization History  Administered Date(s) Administered   Fluad Quad(high Dose 65+) 03/27/2021   Influenza, High Dose Seasonal PF 04/05/2022   Influenza-Unspecified 04/17/2019   PFIZER(Purple Top)SARS-COV-2 Vaccination 07/09/2019, 07/30/2019, 03/16/2020, 09/14/2020, 03/27/2021   Pfizer Covid-19 Vaccine Bivalent Booster 22yr & up 04/05/2022   Pfizer Covid-19 Vaccine Bivalent Booster 5y-11y 03/27/2021, 10/10/2021    Health Maintenance  Topic Date Due   DTaP/Tdap/Td (1 - Tdap) Never done   COVID-19 Vaccine (8 - 2023-24 season) 07/21/2022 (Originally 05/31/2022)   Zoster Vaccines- Shingrix (1 of 2) 09/04/2022 (Originally [birth year]30/1972)   Hepatitis C Screening  12/05/2022 (Originally 01/30/1969)   Pneumonia Vaccine 70 Years old (1 of 1 - PCV) 06/07/2023 (Originally 01/29/2018)   Medicare Annual Wellness (AWV)  07/05/2023   Fecal DNA (Cologuard)  04/27/2024   INFLUENZA VACCINE  Completed   HPV VACCINES  Aged Out       Assessment  This is a routine wellness examination for [facility]  Health Maintenance: Due or Overdue Health Maintenance Due  Topic Date Due   DTaP/Tdap/Td (1 - Tdap) Never done    GFranz Delldoes not need a referral for Community Assistance: Care Management:   no Social Work:    no Prescription Assistance:  no Nutrition/Diabetes Education:  no   Plan:  Personalized Goals  Goals Addressed               This Visit's Progress     Patient Stated (pt-stated)        Patient would like to work on his weight.       Personalized Health Maintenance & Screening Recommendations  TD vaccine Pneumonia vaccine Shingles vaccine  Lung Cancer Screening Recommended: no (Low Dose CT Chest recommended if Age 70-80years, 30 pack-year currently smoking OR have quit w/in past 15 years) Hepatitis C Screening recommended: no HIV Screening recommended: no  Advanced Directives: Written information was not prepared  per patient's request.  Referrals & Orders No orders of the defined types were placed in this encounter.   Follow-up Plan Follow-up with MLuetta Nutting DO as planned Medicare wellness visit in one year.  Patient will access AVS on my chart.   I have personally reviewed and noted the following in the patient's chart:   Medical and social history Use of alcohol, tobacco or illicit drugs  Current medications and supplements Functional ability and status Nutritional status Physical activity Advanced directives List of other physicians Hospitalizations, surgeries, and ER visits in previous 12 months Vitals Screenings to include cognitive, depression, and falls Referrals and appointments  In addition, I have reviewed and discussed with GFranz Dellcertain preventive protocols, quality metrics, and best practice recommendations. A written personalized care plan for preventive services as well as general preventive health recommendations is available and can be mailed to the patient at his request.      BTinnie Gens RN BSN  07/05/2022

## 2022-09-08 DIAGNOSIS — R42 Dizziness and giddiness: Secondary | ICD-10-CM | POA: Diagnosis not present

## 2022-09-09 DIAGNOSIS — C61 Malignant neoplasm of prostate: Secondary | ICD-10-CM | POA: Diagnosis not present

## 2022-09-09 DIAGNOSIS — N200 Calculus of kidney: Secondary | ICD-10-CM | POA: Diagnosis not present

## 2022-09-09 DIAGNOSIS — C679 Malignant neoplasm of bladder, unspecified: Secondary | ICD-10-CM | POA: Diagnosis not present

## 2022-09-10 DIAGNOSIS — C679 Malignant neoplasm of bladder, unspecified: Secondary | ICD-10-CM | POA: Diagnosis not present

## 2022-09-10 DIAGNOSIS — R8289 Other abnormal findings on cytological and histological examination of urine: Secondary | ICD-10-CM | POA: Diagnosis not present

## 2022-09-23 DIAGNOSIS — Z8546 Personal history of malignant neoplasm of prostate: Secondary | ICD-10-CM | POA: Diagnosis not present

## 2022-09-23 DIAGNOSIS — C61 Malignant neoplasm of prostate: Secondary | ICD-10-CM | POA: Diagnosis not present

## 2022-09-23 DIAGNOSIS — N429 Disorder of prostate, unspecified: Secondary | ICD-10-CM | POA: Diagnosis not present

## 2022-09-23 DIAGNOSIS — N400 Enlarged prostate without lower urinary tract symptoms: Secondary | ICD-10-CM | POA: Diagnosis not present

## 2022-09-25 DIAGNOSIS — I10 Essential (primary) hypertension: Secondary | ICD-10-CM | POA: Diagnosis not present

## 2022-09-25 DIAGNOSIS — Z8673 Personal history of transient ischemic attack (TIA), and cerebral infarction without residual deficits: Secondary | ICD-10-CM | POA: Diagnosis not present

## 2022-09-25 DIAGNOSIS — R42 Dizziness and giddiness: Secondary | ICD-10-CM | POA: Diagnosis not present

## 2022-09-26 DIAGNOSIS — R42 Dizziness and giddiness: Secondary | ICD-10-CM | POA: Diagnosis not present

## 2022-09-26 DIAGNOSIS — R519 Headache, unspecified: Secondary | ICD-10-CM | POA: Diagnosis not present

## 2022-10-03 DIAGNOSIS — I10 Essential (primary) hypertension: Secondary | ICD-10-CM | POA: Diagnosis not present

## 2022-10-03 DIAGNOSIS — Z8673 Personal history of transient ischemic attack (TIA), and cerebral infarction without residual deficits: Secondary | ICD-10-CM | POA: Diagnosis not present

## 2022-10-03 DIAGNOSIS — R42 Dizziness and giddiness: Secondary | ICD-10-CM | POA: Diagnosis not present

## 2022-10-03 DIAGNOSIS — I6349 Cerebral infarction due to embolism of other cerebral artery: Secondary | ICD-10-CM | POA: Diagnosis not present

## 2022-12-02 DIAGNOSIS — H52223 Regular astigmatism, bilateral: Secondary | ICD-10-CM | POA: Diagnosis not present

## 2022-12-02 DIAGNOSIS — H35371 Puckering of macula, right eye: Secondary | ICD-10-CM | POA: Diagnosis not present

## 2022-12-02 DIAGNOSIS — Z961 Presence of intraocular lens: Secondary | ICD-10-CM | POA: Diagnosis not present

## 2022-12-17 DIAGNOSIS — W57XXXA Bitten or stung by nonvenomous insect and other nonvenomous arthropods, initial encounter: Secondary | ICD-10-CM | POA: Diagnosis not present

## 2022-12-17 DIAGNOSIS — M25422 Effusion, left elbow: Secondary | ICD-10-CM | POA: Diagnosis not present

## 2022-12-17 DIAGNOSIS — S50362A Insect bite (nonvenomous) of left elbow, initial encounter: Secondary | ICD-10-CM | POA: Diagnosis not present

## 2022-12-19 ENCOUNTER — Ambulatory Visit: Payer: Medicare Other | Admitting: Family Medicine

## 2022-12-23 ENCOUNTER — Encounter: Payer: Self-pay | Admitting: Family Medicine

## 2022-12-23 ENCOUNTER — Ambulatory Visit (INDEPENDENT_AMBULATORY_CARE_PROVIDER_SITE_OTHER): Payer: Medicare Other

## 2022-12-23 ENCOUNTER — Ambulatory Visit (INDEPENDENT_AMBULATORY_CARE_PROVIDER_SITE_OTHER): Payer: Medicare Other | Admitting: Family Medicine

## 2022-12-23 VITALS — BP 135/75 | HR 89 | Ht 70.0 in | Wt 227.0 lb

## 2022-12-23 DIAGNOSIS — G8929 Other chronic pain: Secondary | ICD-10-CM | POA: Diagnosis not present

## 2022-12-23 DIAGNOSIS — N138 Other obstructive and reflux uropathy: Secondary | ICD-10-CM | POA: Diagnosis not present

## 2022-12-23 DIAGNOSIS — M47816 Spondylosis without myelopathy or radiculopathy, lumbar region: Secondary | ICD-10-CM | POA: Diagnosis not present

## 2022-12-23 DIAGNOSIS — Z13220 Encounter for screening for lipoid disorders: Secondary | ICD-10-CM

## 2022-12-23 DIAGNOSIS — I10 Essential (primary) hypertension: Secondary | ICD-10-CM

## 2022-12-23 DIAGNOSIS — E559 Vitamin D deficiency, unspecified: Secondary | ICD-10-CM

## 2022-12-23 DIAGNOSIS — N401 Enlarged prostate with lower urinary tract symptoms: Secondary | ICD-10-CM

## 2022-12-23 DIAGNOSIS — M5441 Lumbago with sciatica, right side: Secondary | ICD-10-CM | POA: Diagnosis not present

## 2022-12-23 DIAGNOSIS — M545 Low back pain, unspecified: Secondary | ICD-10-CM | POA: Diagnosis not present

## 2022-12-23 MED ORDER — PREDNISONE 50 MG PO TABS: ORAL_TABLET | ORAL | 0 refills | Status: DC

## 2022-12-25 DIAGNOSIS — N138 Other obstructive and reflux uropathy: Secondary | ICD-10-CM | POA: Diagnosis not present

## 2022-12-25 DIAGNOSIS — E559 Vitamin D deficiency, unspecified: Secondary | ICD-10-CM | POA: Diagnosis not present

## 2022-12-25 DIAGNOSIS — I10 Essential (primary) hypertension: Secondary | ICD-10-CM | POA: Diagnosis not present

## 2022-12-25 DIAGNOSIS — N401 Enlarged prostate with lower urinary tract symptoms: Secondary | ICD-10-CM | POA: Diagnosis not present

## 2022-12-25 DIAGNOSIS — Z13220 Encounter for screening for lipoid disorders: Secondary | ICD-10-CM | POA: Diagnosis not present

## 2022-12-26 LAB — CMP14+EGFR
ALT: 18 IU/L (ref 0–44)
AST: 21 IU/L (ref 0–40)
Albumin: 4.2 g/dL (ref 3.9–4.9)
Alkaline Phosphatase: 91 IU/L (ref 44–121)
BUN/Creatinine Ratio: 13 (ref 10–24)
BUN: 19 mg/dL (ref 8–27)
Bilirubin Total: 0.6 mg/dL (ref 0.0–1.2)
CO2: 19 mmol/L — ABNORMAL LOW (ref 20–29)
Calcium: 9.8 mg/dL (ref 8.6–10.2)
Chloride: 104 mmol/L (ref 96–106)
Creatinine, Ser: 1.48 mg/dL — ABNORMAL HIGH (ref 0.76–1.27)
Globulin, Total: 2.8 g/dL (ref 1.5–4.5)
Glucose: 89 mg/dL (ref 70–99)
Potassium: 4.7 mmol/L (ref 3.5–5.2)
Sodium: 140 mmol/L (ref 134–144)
Total Protein: 7 g/dL (ref 6.0–8.5)
eGFR: 51 mL/min/{1.73_m2} — ABNORMAL LOW (ref >59)

## 2022-12-26 LAB — LIPID PANEL WITH LDL/HDL RATIO
Cholesterol, Total: 178 mg/dL (ref 100–199)
HDL: 56 mg/dL (ref >39)
LDL Chol Calc (NIH): 100 mg/dL — ABNORMAL HIGH (ref 0–99)
LDL/HDL Ratio: 1.8 ratio (ref 0.0–3.6)
Triglycerides: 127 mg/dL (ref 0–149)
VLDL Cholesterol Cal: 22 mg/dL (ref 5–40)

## 2022-12-26 LAB — PSA: Prostate Specific Ag, Serum: 1.9 ng/mL (ref 0.0–4.0)

## 2022-12-26 LAB — VITAMIN D 25 HYDROXY (VIT D DEFICIENCY, FRACTURES): Vit D, 25-Hydroxy: 31.9 ng/mL (ref 30.0–100.0)

## 2022-12-29 ENCOUNTER — Encounter: Payer: Self-pay | Admitting: Family Medicine

## 2022-12-29 DIAGNOSIS — G8929 Other chronic pain: Secondary | ICD-10-CM | POA: Insufficient documentation

## 2022-12-29 NOTE — Progress Notes (Signed)
Blake Rice - 72 y.o. male MRN [id number]  Date of birth: [birth year]  Subjective Chief Complaint  Patient presents with   Hypertension   Back Pain    C/o  low back / left upper buttock pain  x 3 - 4 months= and when coughs will feel pain in R lower buttocks and R calf x 3 - 4 mohts     HPI Blake Rice is a 72 y.o. male here today for follow-up visit.    Remains on verapamil for management of hypertension.  Blood pressure is well-controlled at this time.  Denies side effects from verapamil.  He has not had any chest pain, shortness of breath, palpitations, vision changes.  He does continue to have chronic recurrent episodes of dizziness.  Cardiology and neurology workup has been unremarkable.  He has had some back pain.  He does have some intermittent radiation down the right buttock and thigh.  This increases with coughing.  Denies weakness into the legs.  No bowel or bladder difficulty.  ROS:  A comprehensive ROS was completed and negative except as noted per HPI  Allergies  Allergen Reactions   Oxytetracycline Other (See Comments)    FOUND WITH ALLERGY SKIN TEST AT AGE OF 6    Past Medical History:  Diagnosis Date   Allergy 1959   Arthritis late 2023   BPH (benign prostatic hyperplasia) 04/08/2013   Cancer (HCC) June 2018   Cataract 1999   Chronic kidney disease, stage 2, mildly decreased GFR 06/30/2018   Dysautonomia (HCC) 09/14/2018   Striational antibody 1:960    Dysautonomia (HCC)    Episodic lightheadedness    History of CVA (cerebrovascular accident) 12/25/2015   HTN (hypertension) 10/12/2013   Irritable bowel syndrome 11/27/2016   Mass of right parotid gland 12/03/2018   PET scan 11/10/18 - Hypermetabolic right parotid nodule on image 37 measuring 12 mm with an SUV Max of 6.2. Image 37     PFO (patent foramen ovale) 12/25/2015   Rosacea    Sleep apnea 2023   Stroke (HCC) 08/10/2000    Past Surgical History:  Procedure Laterality Date   EYE SURGERY  Bilateral 1999   cataract   TRANSURETHRAL RESECTION OF BLADDER TUMOR WITH GYRUS (TURBT-GYRUS)  12/30/2016   TRANSURETHRAL RESECTION OF PROSTATE  06/16/2017    Social History   Socioeconomic History   Marital status: Single    Spouse name: Not on file   Number of children: 0   Years of education: 16   Highest education level: Bachelor's degree (e.g., BA, AB, BS)  Occupational History   Occupation: part-time  Tobacco Use   Smoking status: Former    Current packs/day: 0.00    Average packs/day: 1 pack/day for 27.0 years (27.0 ttl pk-yrs)    Types: Cigarettes    Start date: 08/10/1973    Quit date: 08/10/2000    Years since quitting: 22.4   Smokeless tobacco: Never  Vaping Use   Vaping status: Never Used  Substance and Sexual Activity   Alcohol use: Yes    Alcohol/week: 2.0 - 3.0 standard drinks of alcohol    Types: 2 - 3 Cans of beer per week   Drug use: Never   Sexual activity: Not Currently    Birth control/protection: Abstinence  Other Topics Concern   Not on file  Social History Narrative   Lives alone. He enjoys travelling.    Social Determinants of Health   Financial Resource Strain: Low Risk  (12/19/2022)   Overall  Financial Resource Strain (CARDIA)    Difficulty of Paying Living Expenses: Not hard at all  Food Insecurity: No Food Insecurity (12/19/2022)   Hunger Vital Sign    Worried About Running Out of Food in the Last Year: Never true    Ran Out of Food in the Last Year: Never true  Transportation Needs: No Transportation Needs (12/19/2022)   PRAPARE - Administrator, Civil Service (Medical): No    Lack of Transportation (Non-Medical): No  Physical Activity: Sufficiently Active (12/19/2022)   Exercise Vital Sign    Days of Exercise per Week: 6 days    Minutes of Exercise per Session: 60 min  Stress: No Stress Concern Present (12/19/2022)   [employer] of Occupational Health - Occupational Stress Questionnaire    Feeling of Stress : Not at all   Social Connections: Socially Isolated (12/19/2022)   Social Connection and Isolation Panel [NHANES]    Frequency of Communication with Friends and Family: Twice a week    Frequency of Social Gatherings with Friends and Family: Once a week    Attends Religious Services: Never    Database administrator or Organizations: No    Attends Engineer, structural: Not on file    Marital Status: Never married    Family History  Problem Relation Age of Onset   Hypertension Father     Health Maintenance  Topic Date Due   Hepatitis C Screening  Never done   DTaP/Tdap/Td (1 - Tdap) Never done   Zoster Vaccines- Shingrix (1 of 2) Never done   COVID-19 Vaccine (8 - 2023-24 season) 05/31/2022   INFLUENZA VACCINE  12/05/2022   Pneumonia Vaccine 91+ Years old (1 of 1 - PCV) 06/07/2023 (Originally 01/29/2018)   Medicare Annual Wellness (AWV)  07/05/2023   Fecal DNA (Cologuard)  04/27/2024   HPV VACCINES  Aged Out     ----------------------------------------------------------------------------------------------------------------------------------------------------------------------------------------------------------------- Physical Exam BP 135/75   Pulse 89   Ht 5\' 10"  (1.778 m)   Wt 227 lb (103 kg)   SpO2 93%   BMI 32.57 kg/m   Physical Exam Constitutional:      Appearance: Normal appearance.  HENT:     Head: Normocephalic and atraumatic.  Eyes:     General: No scleral icterus. Cardiovascular:     Rate and Rhythm: Normal rate and regular rhythm.  Pulmonary:     Effort: Pulmonary effort is normal.     Breath sounds: Normal breath sounds.  Musculoskeletal:     Cervical back: Neck supple.  Neurological:     General: No focal deficit present.     Mental Status: He is alert.  Psychiatric:        Mood and Affect: Mood normal.        Behavior: Behavior normal.      ------------------------------------------------------------------------------------------------------------------------------------------------------------------------------------------------------------------- Assessment and Plan  HTN (hypertension) Blood pressure is well-controlled today.  He will continue verapamil at current strength.  BPH with urinary obstruction Updated PSA ordered.  Chronic right-sided low back pain with right-sided sciatica Likely related to degenerative disc disease as he does have increased symptoms with Valsalva.  X-rays of the lumbar spine ordered.  Burst of prednisone added.  Given handout for home exercises.   Meds ordered this encounter  Medications   predniSONE (DELTASONE) 50 MG tablet    Sig: Take 50mg  daily x5 days.    Dispense:  5 tablet    Refill:  0    No follow-ups on file.  This visit occurred during the SARS-CoV-2 public health emergency.  Safety protocols were in place, including screening questions prior to the visit, additional usage of staff PPE, and extensive cleaning of exam room while observing appropriate contact time as indicated for disinfecting solutions.  She

## 2022-12-29 NOTE — Assessment & Plan Note (Signed)
Likely related to degenerative disc disease as he does have increased symptoms with Valsalva.  X-rays of the lumbar spine ordered.  Burst of prednisone added.  Given handout for home exercises.

## 2022-12-29 NOTE — Assessment & Plan Note (Signed)
Updated PSA ordered. °

## 2022-12-29 NOTE — Assessment & Plan Note (Signed)
Blood pressure is well-controlled today.  He will continue verapamil at current strength.

## 2023-01-09 ENCOUNTER — Other Ambulatory Visit: Payer: Self-pay | Admitting: Family Medicine

## 2023-01-09 DIAGNOSIS — Z23 Encounter for immunization: Secondary | ICD-10-CM | POA: Diagnosis not present

## 2023-03-26 ENCOUNTER — Ambulatory Visit (INDEPENDENT_AMBULATORY_CARE_PROVIDER_SITE_OTHER): Payer: Medicare Other | Admitting: Family Medicine

## 2023-03-26 ENCOUNTER — Encounter: Payer: Self-pay | Admitting: Family Medicine

## 2023-03-26 VITALS — BP 132/77 | HR 98 | Ht 70.0 in | Wt 235.0 lb

## 2023-03-26 DIAGNOSIS — G8929 Other chronic pain: Secondary | ICD-10-CM

## 2023-03-26 DIAGNOSIS — I10 Essential (primary) hypertension: Secondary | ICD-10-CM

## 2023-03-26 DIAGNOSIS — M5441 Lumbago with sciatica, right side: Secondary | ICD-10-CM

## 2023-03-26 MED ORDER — VERAPAMIL HCL 80 MG PO TABS: 80.0000 mg | ORAL_TABLET | Freq: Two times a day (BID) | ORAL | 3 refills | Status: DC

## 2023-03-26 MED ORDER — ELIQUIS 5 MG PO TABS: 5.0000 mg | ORAL_TABLET | Freq: Two times a day (BID) | ORAL | 11 refills | Status: DC

## 2023-03-26 NOTE — Assessment & Plan Note (Signed)
Blood pressure is well-controlled today.  He will continue verapamil at current strength.  Has had some higher readings at home. Recommend monitoring at home. Low sodium diet encouraged.  Orders Placed This Encounter  Procedures   CMP14+EGFR   CBC with Differential   TSH

## 2023-03-26 NOTE — Progress Notes (Signed)
Blake Rice - 72 y.o. male MRN [id number]  Date of birth: 06/17/1950  Subjective Chief Complaint  Patient presents with   Hypertension   Sciatica    HPI Blake Rice is a 72 y.o. male here today for follow up visit.   He reports that he is doing fairly well. Remains on verapamil for management of HTN.  BP is fairly well controlled today but reports having elevated readings at home last week.  He is planning on starting a weight loss program with [facility]  and had evaluation last week and BP was noted to be high as well.  He denies symptoms including chest pain, shortness of breath, palpitations, headache or vision changes.   ROS:  A comprehensive ROS was completed and negative except as noted per HPI  Allergies  Allergen Reactions   Oxytetracycline Other (See Comments)    FOUND WITH ALLERGY SKIN TEST AT AGE OF 6    Past Medical History:  Diagnosis Date   Allergy 1959   Arthritis late 2023   BPH (benign prostatic hyperplasia) 04/08/2013   Cancer (HCC) June 2018   Cataract 1999   Chronic kidney disease, stage 2, mildly decreased GFR 06/30/2018   Dysautonomia (HCC) 09/14/2018   Striational antibody 1:960    Dysautonomia (HCC)    Episodic lightheadedness    History of CVA (cerebrovascular accident) 12/25/2015   HTN (hypertension) 10/12/2013   Irritable bowel syndrome 11/27/2016   Mass of right parotid gland 12/03/2018   PET scan 11/10/18 - Hypermetabolic right parotid nodule on image 37 measuring 12 mm with an SUV Max of 6.2. Image 37     PFO (patent foramen ovale) 12/25/2015   Rosacea    Sleep apnea 2023   Stroke (HCC) 08/10/2000    Past Surgical History:  Procedure Laterality Date   EYE SURGERY Bilateral 1999   cataract   TRANSURETHRAL RESECTION OF BLADDER TUMOR WITH GYRUS (TURBT-GYRUS)  12/30/2016   TRANSURETHRAL RESECTION OF PROSTATE  06/16/2017    Social History   Socioeconomic History   Marital status: Single    Spouse name: Not on file    Number of children: 0   Years of education: 16   Highest education level: Bachelor's degree (e.g., BA, AB, BS)  Occupational History   Occupation: part-time  Tobacco Use   Smoking status: Former    Current packs/day: 0.00    Average packs/day: 1 pack/day for 27.0 years (27.0 ttl pk-yrs)    Types: Cigarettes    Start date: 08/10/1973    Quit date: 08/10/2000    Years since quitting: 22.6   Smokeless tobacco: Never  Vaping Use   Vaping status: Never Used  Substance and Sexual Activity   Alcohol use: Yes    Alcohol/week: 2.0 - 3.0 standard drinks of alcohol    Types: 2 - 3 Cans of beer per week   Drug use: Never   Sexual activity: Not Currently    Birth control/protection: Abstinence  Other Topics Concern   Not on file  Social History Narrative   Lives alone. He enjoys travelling.    Social Determinants of Health   Financial Resource Strain: Low Risk  (03/25/2023)   Overall Financial Resource Strain (CARDIA)    Difficulty of Paying Living Expenses: Not hard at all  Food Insecurity: No Food Insecurity (03/25/2023)   Hunger Vital Sign    Worried About Running Out of Food in the Last Year: Never true    Ran Out of Food in the Last  Year: Never true  Transportation Needs: No Transportation Needs (03/25/2023)   PRAPARE - Administrator, Civil Service (Medical): No    Lack of Transportation (Non-Medical): No  Physical Activity: Sufficiently Active (03/25/2023)   Exercise Vital Sign    Days of Exercise per Week: 6 days    Minutes of Exercise per Session: 60 min  Stress: No Stress Concern Present (03/25/2023)   [employer] of Occupational Health - Occupational Stress Questionnaire    Feeling of Stress : Not at all  Social Connections: Socially Isolated (03/25/2023)   Social Connection and Isolation Panel [NHANES]    Frequency of Communication with Friends and Family: Twice a week    Frequency of Social Gatherings with Friends and Family: Once a week    Attends  Religious Services: Never    Database administrator or Organizations: No    Attends Engineer, structural: Not on file    Marital Status: Never married    Family History  Problem Relation Age of Onset   Hypertension Father     Health Maintenance  Topic Date Due   Hepatitis C Screening  Never done   DTaP/Tdap/Td (1 - Tdap) Never done   COVID-19 Vaccine (9 - 2023-24 season) 03/06/2023   Zoster Vaccines- Shingrix (2 of 2) 03/06/2023   Pneumonia Vaccine 82+ Years old (1 of 1 - PCV) 06/07/2023 (Originally 01/29/2018)   Medicare Annual Wellness (AWV)  07/05/2023   Fecal DNA (Cologuard)  04/27/2024   INFLUENZA VACCINE  Completed   HPV VACCINES  Aged Out     ----------------------------------------------------------------------------------------------------------------------------------------------------------------------------------------------------------------- Physical Exam BP 132/77 (BP Location: Left Arm, Patient Position: Sitting, Cuff Size: Large)   Pulse 98   Ht 5\' 10"  (1.778 m)   Wt 235 lb (106.6 kg)   SpO2 97%   BMI 33.72 kg/m   Physical Exam Constitutional:      Appearance: Normal appearance.  Neurological:     General: No focal deficit present.     Mental Status: He is alert.  Psychiatric:        Mood and Affect: Mood normal.        Behavior: Behavior normal.     ------------------------------------------------------------------------------------------------------------------------------------------------------------------------------------------------------------------- Assessment and Plan  HTN (hypertension) Blood pressure is well-controlled today.  He will continue verapamil at current strength.  Has had some higher readings at home. Recommend monitoring at home. Low sodium diet encouraged.  Orders Placed This Encounter  Procedures   CMP14+EGFR   CBC with Differential   TSH     Chronic right-sided low back pain with right-sided sciatica He  has completed >6 weeks of physician directed therapy without much improvement.  Will proceed with lumbar spine MRI>     Meds ordered this encounter  Medications   ELIQUIS 5 MG TABS tablet    Sig: Take 1 tablet (5 mg total) by mouth 2 (two) times daily.    Dispense:  60 tablet    Refill:  11   verapamil (CALAN) 80 MG tablet    Sig: Take 1 tablet (80 mg total) by mouth 2 (two) times daily.    Dispense:  180 tablet    Refill:  3    No follow-ups on file.    This visit occurred during the SARS-CoV-2 public health emergency.  Safety protocols were in place, including screening questions prior to the visit, additional usage of staff PPE, and extensive cleaning of exam room while observing appropriate contact time as indicated for disinfecting solutions.

## 2023-03-26 NOTE — Assessment & Plan Note (Signed)
He has completed >6 weeks of physician directed therapy without much improvement.  Will proceed with lumbar spine MRI>

## 2023-03-27 LAB — CBC WITH DIFFERENTIAL/PLATELET
Basophils Absolute: 0.1 10*3/uL (ref 0.0–0.2)
Basos: 1 %
EOS (ABSOLUTE): 0.2 10*3/uL (ref 0.0–0.4)
Eos: 3 %
Hematocrit: 45.8 % (ref 37.5–51.0)
Hemoglobin: 15.4 g/dL (ref 13.0–17.7)
Immature Grans (Abs): 0 10*3/uL (ref 0.0–0.1)
Immature Granulocytes: 0 %
Lymphocytes Absolute: 1.7 10*3/uL (ref 0.7–3.1)
Lymphs: 30 %
MCH: 31.1 pg (ref 26.6–33.0)
MCHC: 33.6 g/dL (ref 31.5–35.7)
MCV: 93 fL (ref 79–97)
Monocytes Absolute: 0.5 10*3/uL (ref 0.1–0.9)
Monocytes: 9 %
Neutrophils Absolute: 3.1 10*3/uL (ref 1.4–7.0)
Neutrophils: 57 %
Platelets: 230 10*3/uL (ref 150–450)
RBC: 4.95 x10E6/uL (ref 4.14–5.80)
RDW: 12.8 % (ref 11.6–15.4)
WBC: 5.6 10*3/uL (ref 3.4–10.8)

## 2023-03-27 LAB — CMP14+EGFR
ALT: 31 [IU]/L (ref 0–44)
AST: 25 [IU]/L (ref 0–40)
Albumin: 4.4 g/dL (ref 3.9–4.9)
Alkaline Phosphatase: 96 [IU]/L (ref 44–121)
BUN/Creatinine Ratio: 16 (ref 10–24)
BUN: 21 mg/dL (ref 8–27)
Bilirubin Total: 0.6 mg/dL (ref 0.0–1.2)
CO2: 23 mmol/L (ref 20–29)
Calcium: 9.6 mg/dL (ref 8.6–10.2)
Chloride: 104 mmol/L (ref 96–106)
Creatinine, Ser: 1.33 mg/dL — ABNORMAL HIGH (ref 0.76–1.27)
Globulin, Total: 2.7 g/dL (ref 1.5–4.5)
Glucose: 115 mg/dL — ABNORMAL HIGH (ref 70–99)
Potassium: 4.3 mmol/L (ref 3.5–5.2)
Sodium: 142 mmol/L (ref 134–144)
Total Protein: 7.1 g/dL (ref 6.0–8.5)
eGFR: 58 mL/min/{1.73_m2} — ABNORMAL LOW (ref >59)

## 2023-03-27 LAB — TSH: TSH: 1.46 u[IU]/mL (ref 0.450–4.500)

## 2023-03-31 ENCOUNTER — Ambulatory Visit: Payer: Medicare Other

## 2023-03-31 DIAGNOSIS — G8929 Other chronic pain: Secondary | ICD-10-CM | POA: Diagnosis not present

## 2023-03-31 DIAGNOSIS — M48061 Spinal stenosis, lumbar region without neurogenic claudication: Secondary | ICD-10-CM | POA: Diagnosis not present

## 2023-03-31 DIAGNOSIS — M5441 Lumbago with sciatica, right side: Secondary | ICD-10-CM

## 2023-03-31 DIAGNOSIS — M5116 Intervertebral disc disorders with radiculopathy, lumbar region: Secondary | ICD-10-CM | POA: Diagnosis not present

## 2023-03-31 DIAGNOSIS — M4727 Other spondylosis with radiculopathy, lumbosacral region: Secondary | ICD-10-CM | POA: Diagnosis not present

## 2023-04-01 DIAGNOSIS — C61 Malignant neoplasm of prostate: Secondary | ICD-10-CM | POA: Diagnosis not present

## 2023-04-01 DIAGNOSIS — C679 Malignant neoplasm of bladder, unspecified: Secondary | ICD-10-CM | POA: Diagnosis not present

## 2023-04-09 DIAGNOSIS — M5386 Other specified dorsopathies, lumbar region: Secondary | ICD-10-CM | POA: Diagnosis not present

## 2023-04-09 DIAGNOSIS — M51372 Other intervertebral disc degeneration, lumbosacral region with discogenic back pain and lower extremity pain: Secondary | ICD-10-CM | POA: Diagnosis not present

## 2023-04-09 DIAGNOSIS — M9905 Segmental and somatic dysfunction of pelvic region: Secondary | ICD-10-CM | POA: Diagnosis not present

## 2023-04-09 DIAGNOSIS — M546 Pain in thoracic spine: Secondary | ICD-10-CM | POA: Diagnosis not present

## 2023-04-09 DIAGNOSIS — M9902 Segmental and somatic dysfunction of thoracic region: Secondary | ICD-10-CM | POA: Diagnosis not present

## 2023-04-09 DIAGNOSIS — M9904 Segmental and somatic dysfunction of sacral region: Secondary | ICD-10-CM | POA: Diagnosis not present

## 2023-04-09 DIAGNOSIS — M5451 Vertebrogenic low back pain: Secondary | ICD-10-CM | POA: Diagnosis not present

## 2023-04-09 DIAGNOSIS — M9903 Segmental and somatic dysfunction of lumbar region: Secondary | ICD-10-CM | POA: Diagnosis not present

## 2023-04-11 ENCOUNTER — Encounter: Payer: Self-pay | Admitting: Family Medicine

## 2023-04-11 DIAGNOSIS — M546 Pain in thoracic spine: Secondary | ICD-10-CM | POA: Diagnosis not present

## 2023-04-11 DIAGNOSIS — M9904 Segmental and somatic dysfunction of sacral region: Secondary | ICD-10-CM | POA: Diagnosis not present

## 2023-04-11 DIAGNOSIS — M9905 Segmental and somatic dysfunction of pelvic region: Secondary | ICD-10-CM | POA: Diagnosis not present

## 2023-04-11 DIAGNOSIS — M5451 Vertebrogenic low back pain: Secondary | ICD-10-CM | POA: Diagnosis not present

## 2023-04-11 DIAGNOSIS — M51372 Other intervertebral disc degeneration, lumbosacral region with discogenic back pain and lower extremity pain: Secondary | ICD-10-CM | POA: Diagnosis not present

## 2023-04-11 DIAGNOSIS — M5386 Other specified dorsopathies, lumbar region: Secondary | ICD-10-CM | POA: Diagnosis not present

## 2023-04-11 DIAGNOSIS — M9903 Segmental and somatic dysfunction of lumbar region: Secondary | ICD-10-CM | POA: Diagnosis not present

## 2023-04-11 DIAGNOSIS — M9902 Segmental and somatic dysfunction of thoracic region: Secondary | ICD-10-CM | POA: Diagnosis not present

## 2023-04-15 DIAGNOSIS — M5386 Other specified dorsopathies, lumbar region: Secondary | ICD-10-CM | POA: Diagnosis not present

## 2023-04-15 DIAGNOSIS — M9904 Segmental and somatic dysfunction of sacral region: Secondary | ICD-10-CM | POA: Diagnosis not present

## 2023-04-15 DIAGNOSIS — M9905 Segmental and somatic dysfunction of pelvic region: Secondary | ICD-10-CM | POA: Diagnosis not present

## 2023-04-15 DIAGNOSIS — M9903 Segmental and somatic dysfunction of lumbar region: Secondary | ICD-10-CM | POA: Diagnosis not present

## 2023-04-15 DIAGNOSIS — M51372 Other intervertebral disc degeneration, lumbosacral region with discogenic back pain and lower extremity pain: Secondary | ICD-10-CM | POA: Diagnosis not present

## 2023-04-15 DIAGNOSIS — M5451 Vertebrogenic low back pain: Secondary | ICD-10-CM | POA: Diagnosis not present

## 2023-04-15 DIAGNOSIS — M9902 Segmental and somatic dysfunction of thoracic region: Secondary | ICD-10-CM | POA: Diagnosis not present

## 2023-04-15 DIAGNOSIS — M546 Pain in thoracic spine: Secondary | ICD-10-CM | POA: Diagnosis not present

## 2023-04-18 DIAGNOSIS — M5386 Other specified dorsopathies, lumbar region: Secondary | ICD-10-CM | POA: Diagnosis not present

## 2023-04-18 DIAGNOSIS — M9904 Segmental and somatic dysfunction of sacral region: Secondary | ICD-10-CM | POA: Diagnosis not present

## 2023-04-18 DIAGNOSIS — M9902 Segmental and somatic dysfunction of thoracic region: Secondary | ICD-10-CM | POA: Diagnosis not present

## 2023-04-18 DIAGNOSIS — M9903 Segmental and somatic dysfunction of lumbar region: Secondary | ICD-10-CM | POA: Diagnosis not present

## 2023-04-18 DIAGNOSIS — M9905 Segmental and somatic dysfunction of pelvic region: Secondary | ICD-10-CM | POA: Diagnosis not present

## 2023-04-18 DIAGNOSIS — M5451 Vertebrogenic low back pain: Secondary | ICD-10-CM | POA: Diagnosis not present

## 2023-04-18 DIAGNOSIS — M51372 Other intervertebral disc degeneration, lumbosacral region with discogenic back pain and lower extremity pain: Secondary | ICD-10-CM | POA: Diagnosis not present

## 2023-04-18 DIAGNOSIS — M546 Pain in thoracic spine: Secondary | ICD-10-CM | POA: Diagnosis not present

## 2023-04-22 DIAGNOSIS — M9904 Segmental and somatic dysfunction of sacral region: Secondary | ICD-10-CM | POA: Diagnosis not present

## 2023-04-22 DIAGNOSIS — M9903 Segmental and somatic dysfunction of lumbar region: Secondary | ICD-10-CM | POA: Diagnosis not present

## 2023-04-22 DIAGNOSIS — M5451 Vertebrogenic low back pain: Secondary | ICD-10-CM | POA: Diagnosis not present

## 2023-04-22 DIAGNOSIS — M9902 Segmental and somatic dysfunction of thoracic region: Secondary | ICD-10-CM | POA: Diagnosis not present

## 2023-04-22 DIAGNOSIS — M51372 Other intervertebral disc degeneration, lumbosacral region with discogenic back pain and lower extremity pain: Secondary | ICD-10-CM | POA: Diagnosis not present

## 2023-04-22 DIAGNOSIS — M5386 Other specified dorsopathies, lumbar region: Secondary | ICD-10-CM | POA: Diagnosis not present

## 2023-04-22 DIAGNOSIS — M546 Pain in thoracic spine: Secondary | ICD-10-CM | POA: Diagnosis not present

## 2023-04-22 DIAGNOSIS — M9905 Segmental and somatic dysfunction of pelvic region: Secondary | ICD-10-CM | POA: Diagnosis not present

## 2023-04-25 DIAGNOSIS — M9904 Segmental and somatic dysfunction of sacral region: Secondary | ICD-10-CM | POA: Diagnosis not present

## 2023-04-25 DIAGNOSIS — M546 Pain in thoracic spine: Secondary | ICD-10-CM | POA: Diagnosis not present

## 2023-04-25 DIAGNOSIS — M51372 Other intervertebral disc degeneration, lumbosacral region with discogenic back pain and lower extremity pain: Secondary | ICD-10-CM | POA: Diagnosis not present

## 2023-04-25 DIAGNOSIS — M5386 Other specified dorsopathies, lumbar region: Secondary | ICD-10-CM | POA: Diagnosis not present

## 2023-04-25 DIAGNOSIS — M9903 Segmental and somatic dysfunction of lumbar region: Secondary | ICD-10-CM | POA: Diagnosis not present

## 2023-04-25 DIAGNOSIS — M9905 Segmental and somatic dysfunction of pelvic region: Secondary | ICD-10-CM | POA: Diagnosis not present

## 2023-04-25 DIAGNOSIS — M9902 Segmental and somatic dysfunction of thoracic region: Secondary | ICD-10-CM | POA: Diagnosis not present

## 2023-04-25 DIAGNOSIS — M5451 Vertebrogenic low back pain: Secondary | ICD-10-CM | POA: Diagnosis not present

## 2023-05-02 DIAGNOSIS — M9902 Segmental and somatic dysfunction of thoracic region: Secondary | ICD-10-CM | POA: Diagnosis not present

## 2023-05-02 DIAGNOSIS — M9905 Segmental and somatic dysfunction of pelvic region: Secondary | ICD-10-CM | POA: Diagnosis not present

## 2023-05-02 DIAGNOSIS — M546 Pain in thoracic spine: Secondary | ICD-10-CM | POA: Diagnosis not present

## 2023-05-02 DIAGNOSIS — M9904 Segmental and somatic dysfunction of sacral region: Secondary | ICD-10-CM | POA: Diagnosis not present

## 2023-05-02 DIAGNOSIS — M9903 Segmental and somatic dysfunction of lumbar region: Secondary | ICD-10-CM | POA: Diagnosis not present

## 2023-05-02 DIAGNOSIS — M5451 Vertebrogenic low back pain: Secondary | ICD-10-CM | POA: Diagnosis not present

## 2023-05-02 DIAGNOSIS — M51372 Other intervertebral disc degeneration, lumbosacral region with discogenic back pain and lower extremity pain: Secondary | ICD-10-CM | POA: Diagnosis not present

## 2023-05-02 DIAGNOSIS — M5386 Other specified dorsopathies, lumbar region: Secondary | ICD-10-CM | POA: Diagnosis not present

## 2023-07-08 ENCOUNTER — Ambulatory Visit: Payer: Medicare Other

## 2023-07-08 VITALS — Ht 70.0 in | Wt 187.0 lb

## 2023-07-08 DIAGNOSIS — Z0000 Encounter for general adult medical examination without abnormal findings: Secondary | ICD-10-CM

## 2023-07-08 NOTE — Progress Notes (Signed)
 Subjective:   Blake Rice is a 73 y.o. male who presents for Medicare Annual/Subsequent preventive examination.  Visit Complete: Virtual I connected with  [name] on 07/08/23 by a audio enabled telemedicine application and verified that I am speaking with the correct person using two identifiers.  Patient Location: Home  Provider Location: Office/Clinic  I discussed the limitations of evaluation and management by telemedicine. The patient expressed understanding and agreed to proceed.  Vital Signs: Because this visit was a virtual/telehealth visit, some criteria may be missing or patient reported. Any vitals not documented were not able to be obtained and vitals that have been documented are patient reported.  Patient Medicare AWV questionnaire was completed by the patient on n/a; I have confirmed that all information answered by patient is correct and no changes since this date.  Cardiac Risk Factors include: advanced age (>69men, >82 women);smoking/ tobacco exposure;hypertension;male gender;obesity (BMI >30kg/m2);family history of premature cardiovascular disease     Objective:    Today's Vitals   07/08/23 1457  Weight: 187 lb (84.8 kg)  Height: 5\' 10"  (1.778 m)   Body mass index is 26.83 kg/m.     07/08/2023    3:09 PM 07/05/2022    3:15 PM 11/27/2016   10:55 AM  Advanced Directives  Does Patient Have a Medical Advance Directive? Yes Yes Yes  Type of Advance Directive Living will Living will Living will  Does patient want to make changes to medical advance directive? No - Patient declined No - Patient declined No - Patient declined    Current Medications (verified) Outpatient Encounter Medications as of 07/08/2023  Medication Sig   ampicillin (PRINCIPEN) 500 MG capsule Once every other day   ELIQUIS 5 MG TABS tablet Take 1 tablet (5 mg total) by mouth 2 (two) times daily.   meclizine (ANTIVERT) 25 MG tablet Take 1 tablet (25 mg total) by mouth 3 (three) times  daily as needed for dizziness. (Patient taking differently: Take 25 mg by mouth 2 (two) times daily as needed for dizziness.)   verapamil (CALAN) 80 MG tablet Take 1 tablet (80 mg total) by mouth 2 (two) times daily.   No facility-administered encounter medications on file as of 07/08/2023.    Allergies (verified) Oxytetracycline   History: Past Medical History:  Diagnosis Date   Allergy 1959   Arthritis late 2023   BPH (benign prostatic hyperplasia) 04/08/2013   Cancer [facility]) June 2018   Cataract 1999   Chronic kidney disease, stage 2, mildly decreased GFR 06/30/2018   Dysautonomia (HCC) 09/14/2018   Striational antibody 1:960    Dysautonomia (HCC)    Episodic lightheadedness    History of CVA (cerebrovascular accident) 12/25/2015   HTN (hypertension) 10/12/2013   Irritable bowel syndrome 11/27/2016   Mass of right parotid gland 12/03/2018   PET scan 11/10/18 - Hypermetabolic right parotid nodule on image 37 measuring 12 mm with an SUV Max of 6.2. Image 37     PFO (patent foramen ovale) 12/25/2015   Rosacea    Sleep apnea 2023   Stroke (HCC) 08/10/2000   Past Surgical History:  Procedure Laterality Date   EYE SURGERY Bilateral 1999   cataract   TRANSURETHRAL RESECTION OF BLADDER TUMOR WITH GYRUS (TURBT-GYRUS)  12/30/2016   TRANSURETHRAL RESECTION OF PROSTATE  06/16/2017   Family History  Problem Relation Age of Onset   Hypertension Father    Social History   Socioeconomic History   Marital status: Single    Spouse name: Not on  file   Number of children: 0   Years of education: 16   Highest education level: Bachelor's degree (e.g., BA, AB, BS)  Occupational History   Occupation: part-time  Tobacco Use   Smoking status: Former    Current packs/day: 0.00    Average packs/day: 1 pack/day for 27.0 years (27.0 ttl pk-yrs)    Types: Cigarettes    Start date: 08/10/1973    Quit date: 08/10/2000    Years since quitting: 22.9   Smokeless tobacco: Never  Vaping Use   Vaping  status: Never Used  Substance and Sexual Activity   Alcohol use: Yes    Alcohol/week: 2.0 - 3.0 standard drinks of alcohol    Types: 2 - 3 Cans of beer per week   Drug use: Never   Sexual activity: Not Currently    Birth control/protection: Abstinence  Other Topics Concern   Not on file  Social History Narrative   Lives alone. He enjoys travelling.    Social Drivers of Corporate investment banker Strain: Low Risk  (07/08/2023)   Overall Financial Resource Strain (CARDIA)    Difficulty of Paying Living Expenses: Not hard at all  Food Insecurity: No Food Insecurity (07/08/2023)   Hunger Vital Sign    Worried About Running Out of Food in the Last Year: Never true    Ran Out of Food in the Last Year: Never true  Transportation Needs: No Transportation Needs (07/08/2023)   PRAPARE - Administrator, Civil Service (Medical): No    Lack of Transportation (Non-Medical): No  Physical Activity: Sufficiently Active (07/08/2023)   Exercise Vital Sign    Days of Exercise per Week: 6 days    Minutes of Exercise per Session: 60 min  Stress: No Stress Concern Present (07/08/2023)   [employer] of Occupational Health - Occupational Stress Questionnaire    Feeling of Stress : Not at all  Social Connections: Socially Isolated (07/08/2023)   Social Connection and Isolation Panel [NHANES]    Frequency of Communication with Friends and Family: More than three times a week    Frequency of Social Gatherings with Friends and Family: More than three times a week    Attends Religious Services: Never    Database administrator or Organizations: No    Attends Engineer, structural: Never    Marital Status: Never married    Tobacco Counseling Counseling given: Not Answered   Clinical Intake:  Pre-visit preparation completed: Yes  Pain : No/denies pain     BMI - recorded: 26.83 Nutritional Status: BMI 25 -29 Overweight Nutritional Risks: None Diabetes: No  How often do you  need to have someone help you when you read instructions, pamphlets, or other written materials from your doctor or pharmacy?: 1 - Never What is the last grade level you completed in school?: 16  Interpreter Needed?: No      Activities of Daily Living    07/08/2023    2:59 PM  In your present state of health, do you have any difficulty performing the following activities:  Hearing? 0  Vision? 0  Comment glasses  Difficulty concentrating or making decisions? 0  Walking or climbing stairs? 0  Dressing or bathing? 0  Doing errands, shopping? 0  Preparing Food and eating ? N  Using the Toilet? N  In the past six months, have you accidently leaked urine? N  Do you have problems with loss of bowel control? N  Managing your Medications?  N  Managing your Finances? N  Housekeeping or managing your Housekeeping? N    Patient Care Team: Everrett Coombe, DO as PCP - General (Family Medicine) Melene Plan, MD as Consulting Physician (Urology) Marian Sorrow, MD as Consulting Physician (Neurology)  Indicate any recent Medical Services you may have received from other than [facility] providers in the past year (date may be approximate).     Assessment:   This is a routine wellness examination for Krupp.  Hearing/Vision screen Hearing Screening - Comments:: Unable to check Vision Screening - Comments:: Unable to check   Goals Addressed             This Visit's Progress    Achieve a Healthy Weight-Pediatric       He would like to lose 23 more pounds.       Depression Screen    07/08/2023    3:09 PM 07/08/2023    3:08 PM 12/23/2022    2:19 PM 07/05/2022    3:06 PM 06/06/2022    1:58 PM 06/05/2021    4:02 PM 08/22/2020    1:58 PM  PHQ 2/9 Scores  PHQ - 2 Score 0 0 0 0 0 0 0    Fall Risk    07/08/2023    3:13 PM 12/23/2022    2:19 PM 07/05/2022    3:06 PM 07/01/2022    9:54 PM 06/06/2022    1:58 PM  Fall Risk   Falls in the past year? 0 0 0 0 0  Number falls in past yr: 0 0 0 0  0  Injury with Fall? 0 0 0 0 0  Risk for fall due to : No Fall Risks No Fall Risks No Fall Risks  No Fall Risks  Follow up Falls evaluation completed Falls evaluation completed Falls evaluation completed  Falls evaluation completed    MEDICARE RISK AT HOME:    TIMED UP AND GO:  Was the test performed?  No    Cognitive Function:        07/08/2023    3:13 PM 07/05/2022    3:26 PM  6CIT Screen  What Year?  0 points  What month?  0 points  What time? 0 points 0 points  Count back from 20 0 points 0 points  Months in reverse 0 points 0 points  Repeat phrase 0 points 0 points  Total Score  0 points    Immunizations Immunization History  Administered Date(s) Administered   Fluad Quad(high Dose 65+) 03/27/2021   Fluad Trivalent(High Dose 65+) 01/09/2023   Influenza, High Dose Seasonal PF 04/05/2022   Influenza-Unspecified 04/17/2019   PFIZER(Purple Top)SARS-COV-2 Vaccination 07/09/2019, 07/30/2019, 03/16/2020, 09/14/2020, 03/27/2021   Pfizer Covid-19 Vaccine Bivalent Booster 42yrs & up 04/05/2022   Pfizer Covid-19 Vaccine Bivalent Booster 5y-11y 03/27/2021, 10/10/2021   Pfizer(Comirnaty)Fall Seasonal Vaccine 12 years and older 01/09/2023   Zoster Recombinant(Shingrix) 01/09/2023, 05/14/2023    TDAP status: Due, Education has been provided regarding the importance of this vaccine. Advised may receive this vaccine at local pharmacy or Health Dept. Aware to provide a copy of the vaccination record if obtained from local pharmacy or Health Dept. Verbalized acceptance and understanding.  Flu Vaccine status: Up to date  Pneumococcal vaccine status: Due, Education has been provided regarding the importance of this vaccine. Advised may receive this vaccine at local pharmacy or Health Dept. Aware to provide a copy of the vaccination record if obtained from local pharmacy or Health Dept. Verbalized acceptance and understanding.  Covid-19 vaccine status: Completed vaccines  Qualifies for  Shingles Vaccine? Yes   Zostavax completed Yes   Shingrix Completed?: Yes  Screening Tests Health Maintenance  Topic Date Due   Pneumonia Vaccine 84+ Years old (1 of 2 - PCV) Never done   Hepatitis C Screening  Never done   DTaP/Tdap/Td (1 - Tdap) Never done   COVID-19 Vaccine (9 - 2024-25 season) 03/06/2023   Fecal DNA (Cologuard)  04/27/2024   Medicare Annual Wellness (AWV)  07/07/2024   INFLUENZA VACCINE  Completed   Zoster Vaccines- Shingrix  Completed   HPV VACCINES  Aged Out    Health Maintenance  Health Maintenance Due  Topic Date Due   Pneumonia Vaccine 47+ Years old (1 of 2 - PCV) Never done   Hepatitis C Screening  Never done   DTaP/Tdap/Td (1 - Tdap) Never done   COVID-19 Vaccine (9 - 2024-25 season) 03/06/2023    Colorectal cancer screening: Type of screening: Cologuard. Completed 04/27/2021. Repeat every 3 years  Lung Cancer Screening: (Low Dose CT Chest recommended if Age 39-80 years, 20 pack-year currently smoking OR have quit w/in 15years.) does not qualify.   Lung Cancer Screening Referral: n/a  Additional Screening:  Hepatitis C Screening: does qualify; Completed not yet  Vision Screening: Recommended annual ophthalmology exams for early detection of glaucoma and other disorders of the eye. Is the patient up to date with their annual eye exam?  Yes  Who is the provider or what is the name of the office in which the patient attends annual eye exams? [facility] If pt is not established with a provider, would they like to be referred to a provider to establish care?  N/a .   Dental Screening: Recommended annual dental exams for proper oral hygiene   Community Resource Referral / Chronic Care Management: CRR required this visit?  No   CCM required this visit?  No     Plan:     I have personally reviewed and noted the following in the patient's chart:   Medical and social history Use of alcohol, tobacco or illicit drugs  Current medications  and supplements including opioid prescriptions. Patient is not currently taking opioid prescriptions. Functional ability and status Nutritional status Physical activity Advanced directives List of other physicians Hospitalizations, surgeries, and ER visits in previous 12 months Vitals Screenings to include cognitive, depression, and falls Referrals and appointments  In addition, I have reviewed and discussed with patient certain preventive protocols, quality metrics, and best practice recommendations. A written personalized care plan for preventive services as well as general preventive health recommendations were provided to patient.     Esmond Harps, CMA   07/08/2023   After Visit Summary: (MyChart) Due to this being a telephonic visit, the after visit summary with patients personalized plan was offered to patient via MyChart   Nurse Notes:   Irl Bodie is a 73 y.o. male patient of Everrett Coombe, DO who had a Medicare Annual Wellness Visit today via telephone. Carmel is Working part time and lives alone. He does not have any children. he reports that he is socially active and does interact with friends/family regularly. He is moderately physically active and enjoys travelling. He is hoping to go to [place] this summer.

## 2023-07-08 NOTE — Patient Instructions (Signed)
  [name] , Thank you for taking time to come for your Medicare Wellness Visit. I appreciate your ongoing commitment to your health goals. Please review the following plan we discussed and let me know if I can assist you in the future.   These are the goals we discussed:  Goals       Achieve a Healthy Weight-Pediatric      He would like to lose 23 more pounds.       Patient Stated (pt-stated)      Patient would like to work on his weight.        This is a list of the screening recommended for you and due dates:  Health Maintenance  Topic Date Due   Pneumonia Vaccine (1 of 2 - PCV) Never done   Hepatitis C Screening  Never done   DTaP/Tdap/Td vaccine (1 - Tdap) Never done   COVID-19 Vaccine (9 - 2024-25 season) 03/06/2023   Cologuard (Stool DNA test)  04/27/2024   Medicare Annual Wellness Visit  07/07/2024   Flu Shot  Completed   Zoster (Shingles) Vaccine  Completed   HPV Vaccine  Aged Out

## 2024-01-14 ENCOUNTER — Encounter: Payer: Self-pay | Admitting: Family Medicine

## 2024-04-07 ENCOUNTER — Encounter: Payer: Self-pay | Admitting: Family Medicine

## 2024-04-07 ENCOUNTER — Other Ambulatory Visit: Payer: Self-pay | Admitting: Family Medicine

## 2024-04-08 ENCOUNTER — Encounter: Payer: Self-pay | Admitting: Family Medicine

## 2024-04-08 NOTE — Telephone Encounter (Signed)
Message left for patient to call back and schedule a follow up appointment.

## 2024-04-08 NOTE — Telephone Encounter (Signed)
 Requesting rx rf of  Ampicillin  500mg  Last written 01/10/2023 Eliquis  5mg  Last written 03/26/2023 Verapamil  80mg   Last written 03/26/2023 Last OV 03/26/2023 Upcoming appt 07/13/2024 AWV-telephone with nurse  Attempted call to patient to assist him in scheduling visit with provider as has been over one year since last seen  Left a voice mail message requesting a return call.

## 2024-04-09 NOTE — Telephone Encounter (Signed)
 Patient scheduled an appointment.

## 2024-04-13 ENCOUNTER — Ambulatory Visit (INDEPENDENT_AMBULATORY_CARE_PROVIDER_SITE_OTHER): Admitting: Family Medicine

## 2024-04-13 ENCOUNTER — Encounter: Payer: Self-pay | Admitting: Family Medicine

## 2024-04-13 ENCOUNTER — Ambulatory Visit: Admitting: Family Medicine

## 2024-04-13 VITALS — BP 143/71 | HR 82 | Ht 70.0 in | Wt 205.0 lb

## 2024-04-13 DIAGNOSIS — L719 Rosacea, unspecified: Secondary | ICD-10-CM

## 2024-04-13 DIAGNOSIS — L819 Disorder of pigmentation, unspecified: Secondary | ICD-10-CM

## 2024-04-13 DIAGNOSIS — D485 Neoplasm of uncertain behavior of skin: Secondary | ICD-10-CM | POA: Insufficient documentation

## 2024-04-13 DIAGNOSIS — Z8551 Personal history of malignant neoplasm of bladder: Secondary | ICD-10-CM

## 2024-04-13 DIAGNOSIS — I10 Essential (primary) hypertension: Secondary | ICD-10-CM

## 2024-04-13 MED ORDER — ELIQUIS 5 MG PO TABS: 5.0000 mg | ORAL_TABLET | Freq: Two times a day (BID) | ORAL | 3 refills | Status: AC

## 2024-04-13 MED ORDER — CICLOPIROX 8 % EX SOLN: Freq: Every day | CUTANEOUS | 0 refills | Status: AC

## 2024-04-13 MED ORDER — VERAPAMIL HCL 80 MG PO TABS: 80.0000 mg | ORAL_TABLET | Freq: Two times a day (BID) | ORAL | 3 refills | Status: AC

## 2024-04-13 MED ORDER — AMPICILLIN 500 MG PO CAPS: ORAL_CAPSULE | ORAL | 2 refills | Status: AC

## 2024-04-13 NOTE — Assessment & Plan Note (Signed)
 Blood pressure is  stable.  He will continue verapamil  at current strength.  Has had some lower readings at home. Experience orthostasis with trying to increase strength of medications. Recommend monitoring at home. Low sodium diet encouraged.

## 2024-04-13 NOTE — Assessment & Plan Note (Signed)
 He will remain on indefinite anticoagulation.  Continue Eliquis  at 2.5 mg twice daily.

## 2024-04-13 NOTE — Assessment & Plan Note (Signed)
Continue ampicillin at current strength

## 2024-04-13 NOTE — Assessment & Plan Note (Signed)
 Likely fungal in nature.  Trial of Penlac .  Referral placed to dermatology as well to be sure not subungual melanoma.

## 2024-04-13 NOTE — Assessment & Plan Note (Signed)
 Followed by urology.  Stable at this time.

## 2024-04-13 NOTE — Progress Notes (Signed)
 Blake Rice - 73 y.o. male MRN [id number]  Date of birth: Jun 26, 1950  Subjective Chief Complaint  Patient presents with   Medication Refill    HPI Blake Rice is a 73 y.o. male here today for follow up visit.   He reports that he is doing well.  He had R parotidectomy in September.  He has recovered well from this.    He continues on verapamil  for management of HTN.  BP elevated on initial check today. Denies chest pain, shortness of breath, palpitations, headache or vision changes.    Has had pigmented area on R great toe for about 6 months.  He though there was blood under the nail initially then assumed it was likely a fungal infection.   He had f/u visit with urology today as well.   Doing well in regards to this.  He has f/u cystoscopy in June.    ROS:  A comprehensive ROS was completed and negative except as noted per HPI  Allergies  Allergen Reactions   Oxytetracycline Other (See Comments)    FOUND WITH ALLERGY SKIN TEST AT AGE OF 6    Past Medical History:  Diagnosis Date   Allergy 1959   Arthritis late 2023   BPH (benign prostatic hyperplasia) 04/08/2013   Cancer (HCC) June 2018   Cataract 1999   Chronic kidney disease, stage 2, mildly decreased GFR 06/30/2018   Dysautonomia (HCC) 09/14/2018   Striational antibody 1:960    Dysautonomia (HCC)    Episodic lightheadedness    History of CVA (cerebrovascular accident) 12/25/2015   HTN (hypertension) 10/12/2013   Irritable bowel syndrome 11/27/2016   Mass of right parotid gland 12/03/2018   PET scan 11/10/18 - Hypermetabolic right parotid nodule on image 37 measuring 12 mm with an SUV Max of 6.2. Image 37     PFO (patent foramen ovale) 12/25/2015   Rosacea    Sleep apnea 2023   Stroke (HCC) 08/10/2000    Past Surgical History:  Procedure Laterality Date   EYE SURGERY Bilateral 1999   cataract   TRANSURETHRAL RESECTION OF BLADDER TUMOR WITH GYRUS (TURBT-GYRUS)  12/30/2016   TRANSURETHRAL RESECTION OF  PROSTATE  06/16/2017    Social History   Socioeconomic History   Marital status: Single    Spouse name: Not on file   Number of children: 0   Years of education: 16   Highest education level: Bachelor's degree (e.g., BA, AB, BS)  Occupational History   Occupation: part-time  Tobacco Use   Smoking status: Former    Current packs/day: 0.00    Average packs/day: 1 pack/day for 27.0 years (27.0 ttl pk-yrs)    Types: Cigarettes    Start date: 08/10/1973    Quit date: 08/10/2000    Years since quitting: 23.6   Smokeless tobacco: Never  Vaping Use   Vaping status: Never Used  Substance and Sexual Activity   Alcohol use: Yes    Alcohol/week: 2.0 - 3.0 standard drinks of alcohol    Types: 2 - 3 Cans of beer per week   Drug use: Never   Sexual activity: Not Currently    Birth control/protection: Abstinence  Other Topics Concern   Not on file  Social History Narrative   Lives alone. He enjoys travelling.    Social Drivers of Health   Financial Resource Strain: Low Risk (04/10/2024)   Received from [facility]   Overall Financial Resource Strain (CARDIA)    How hard is it for you to pay  for the very basics like food, housing, medical care, and heating?: Not hard at all  Food Insecurity: No Food Insecurity (04/10/2024)   Received from [facility]   Hunger Vital Sign    Within the past 12 months, you worried that your food would run out before you got the money to buy more.: Never true    Within the past 12 months, the food you bought just didn't last and you didn't have money to get more.: Never true  Transportation Needs: No Transportation Needs (04/10/2024)   Received from [facility] - Transportation    In the past 12 months, has lack of transportation kept you from medical appointments or from getting medications?: No    In the past 12 months, has lack of transportation kept you from meetings, work, or from getting things needed for daily living?: No  Physical  Activity: Sufficiently Active (04/10/2024)   Received from [facility]   Exercise Vital Sign    On average, how many days per week do you engage in moderate to strenuous exercise (like a brisk walk)?: 6 days    On average, how many minutes do you engage in exercise at this level?: 60 min  Stress: No Stress Concern Present (04/10/2024)   Received from [facility] - Occupational Stress Questionnaire    Do you feel stress - tense, restless, nervous, or anxious, or unable to sleep at night because your mind is troubled all the time - these days?: Not at all  Social Connections: Somewhat Isolated (04/10/2024)   Received from [facility]   Social Network    How would you rate your social network (family, work, friends)?: Restricted participation with some degree of social isolation    Family History  Problem Relation Age of Onset   Hypertension Father     Health Maintenance  Topic Date Due   Hepatitis C Screening  Never done   DTaP/Tdap/Td (1 - Tdap) 04/13/2025 (Originally 01/30/1970)   Pneumococcal Vaccine: 50+ Years (1 of 2 - PCV) 04/13/2025 (Originally 01/30/1972)   Fecal DNA (Cologuard)  04/27/2024   Medicare Annual Wellness (AWV)  07/07/2024   COVID-19 Vaccine (10 - Pfizer risk 2025-26 season) 07/08/2024   Influenza Vaccine  Completed   Zoster Vaccines- Shingrix  Completed   Meningococcal B Vaccine  Aged Out     ----------------------------------------------------------------------------------------------------------------------------------------------------------------------------------------------------------------- Physical Exam BP (!) 143/71   Pulse 82   Ht 5' 10 (1.778 m)   Wt 205 lb (93 kg)   SpO2 96%   BMI 29.41 kg/m   Physical Exam Constitutional:      Appearance: Normal appearance.  HENT:     Head: Normocephalic and atraumatic.  Cardiovascular:     Rate and Rhythm: Normal rate and regular rhythm.  Pulmonary:      Effort: Pulmonary effort is normal.     Breath sounds: Normal breath sounds.  Musculoskeletal:     Cervical back: Neck supple.  Skin:    Comments: Pigmented area on along nail fold on R great toe.   Neurological:     General: No focal deficit present.     Mental Status: He is alert.  Psychiatric:        Mood and Affect: Mood normal.        Behavior: Behavior normal.     ------------------------------------------------------------------------------------------------------------------------------------------------------------------------------------------------------------------- Assessment and Plan  HTN (hypertension) Blood pressure is  stable.  He will continue verapamil  at current strength.  Has had  some lower readings at home. Experience orthostasis with trying to increase strength of medications. Recommend monitoring at home. Low sodium diet encouraged.     Rosacea Continue ampicillin  at current strength  Pigmented skin lesion of uncertain behavior of lower extremity Likely fungal in nature.  Trial of Penlac .  Referral placed to dermatology as well to be sure not subungual melanoma.   Left pulmonary embolus He will remain on indefinite anticoagulation.  Continue Eliquis  at 2.5 mg twice daily.     History of primary bladder cancer Followed by urology.  Stable at this time.    Meds ordered this encounter  Medications   ciclopirox  (PENLAC ) 8 % solution    Sig: Apply topically at bedtime. Apply over nail and surrounding skin. Apply daily over previous coat. After seven (7) days, may remove with alcohol and continue cycle.    Dispense:  6.6 mL    Refill:  0   ampicillin  (PRINCIPEN) 500 MG capsule    Sig: TAKE 1 CAPSULE BY MOUTH EVERY OTHER DAY    Dispense:  45 capsule    Refill:  2   ELIQUIS  5 MG TABS tablet    Sig: Take 1 tablet (5 mg total) by mouth 2 (two) times daily.    Dispense:  60 tablet    Refill:  3   verapamil  (CALAN ) 80 MG tablet    Sig: Take 1 tablet (80  mg total) by mouth 2 (two) times daily.    Dispense:  180 tablet    Refill:  3    Return in about 6 months (around 10/12/2024) for Hypertension.

## 2024-05-11 ENCOUNTER — Telehealth: Payer: Self-pay

## 2024-05-11 DIAGNOSIS — Z1212 Encounter for screening for malignant neoplasm of rectum: Secondary | ICD-10-CM

## 2024-05-11 NOTE — Telephone Encounter (Signed)
 Copied from CRM [id number]. Topic: Clinical - Medical Advice >> May 10, 2024  3:52 PM Delon T wrote: Reason for CRM: have not received cologuard yet and wanted to check on when he will be expecting it- [phone]

## 2024-05-11 NOTE — Telephone Encounter (Signed)
 Pended Cologuard order for sending   How long can a  patient get just cologuard or at some point will they need a colonoscopy instead?

## 2024-05-17 NOTE — Telephone Encounter (Signed)
Attempted call to patient . Phone rang without answer. Could not leave a voice mail message.

## 2024-05-21 NOTE — Telephone Encounter (Signed)
 Patient informed. States cologuard was received yesterday and he has already completed this and sent it back to the company.

## 2024-05-27 LAB — COLOGUARD: COLOGUARD: NEGATIVE

## 2024-05-28 ENCOUNTER — Ambulatory Visit: Payer: Self-pay | Admitting: Family Medicine

## 2024-06-03 ENCOUNTER — Encounter: Payer: Self-pay | Admitting: Family Medicine

## 2024-07-13 ENCOUNTER — Ambulatory Visit

## 2024-10-12 ENCOUNTER — Ambulatory Visit: Admitting: Family Medicine
